# Patient Record
Sex: Male | Born: 1955 | Race: Black or African American | Hispanic: No | Smoking: Former smoker
Health system: Southern US, Community
[De-identification: ages and names within clinical notes are randomized; demographics above are authoritative.]

## PROBLEM LIST (undated history)

## (undated) DIAGNOSIS — Z972 Presence of dental prosthetic device (complete) (partial): Secondary | ICD-10-CM

## (undated) DIAGNOSIS — I10 Essential (primary) hypertension: Secondary | ICD-10-CM

## (undated) DIAGNOSIS — T7840XA Allergy, unspecified, initial encounter: Secondary | ICD-10-CM

## (undated) DIAGNOSIS — D649 Anemia, unspecified: Secondary | ICD-10-CM

## (undated) DIAGNOSIS — M1990 Unspecified osteoarthritis, unspecified site: Secondary | ICD-10-CM

## (undated) DIAGNOSIS — R519 Headache, unspecified: Secondary | ICD-10-CM

## (undated) DIAGNOSIS — G709 Myoneural disorder, unspecified: Secondary | ICD-10-CM

## (undated) DIAGNOSIS — R51 Headache: Secondary | ICD-10-CM

## (undated) DIAGNOSIS — K219 Gastro-esophageal reflux disease without esophagitis: Secondary | ICD-10-CM

## (undated) DIAGNOSIS — J40 Bronchitis, not specified as acute or chronic: Secondary | ICD-10-CM

## (undated) DIAGNOSIS — G8929 Other chronic pain: Secondary | ICD-10-CM

## (undated) DIAGNOSIS — Z973 Presence of spectacles and contact lenses: Secondary | ICD-10-CM

## (undated) DIAGNOSIS — E785 Hyperlipidemia, unspecified: Secondary | ICD-10-CM

## (undated) DIAGNOSIS — M542 Cervicalgia: Secondary | ICD-10-CM

## (undated) DIAGNOSIS — F419 Anxiety disorder, unspecified: Secondary | ICD-10-CM

## (undated) HISTORY — DX: Allergy, unspecified, initial encounter: T78.40XA

## (undated) HISTORY — DX: Unspecified osteoarthritis, unspecified site: M19.90

## (undated) HISTORY — PX: COLONOSCOPY: SHX174

## (undated) HISTORY — PX: JOINT REPLACEMENT: SHX530

## (undated) HISTORY — DX: Hyperlipidemia, unspecified: E78.5

## (undated) HISTORY — PX: TONSILLECTOMY: SUR1361

## (undated) HISTORY — PX: MULTIPLE TOOTH EXTRACTIONS: SHX2053

## (undated) HISTORY — PX: SHOULDER ARTHROSCOPY DISTAL CLAVICLE EXCISION AND OPEN ROTATOR CUFF REPAIR: SHX2396

---

## 1976-03-12 HISTORY — PX: KNEE SURGERY: SHX244

## 2010-05-29 ENCOUNTER — Ambulatory Visit: Payer: Non-veteran care | Attending: Family Medicine | Admitting: Rehabilitation

## 2010-05-29 DIAGNOSIS — M25669 Stiffness of unspecified knee, not elsewhere classified: Secondary | ICD-10-CM | POA: Insufficient documentation

## 2010-05-29 DIAGNOSIS — M25569 Pain in unspecified knee: Secondary | ICD-10-CM | POA: Insufficient documentation

## 2010-05-29 DIAGNOSIS — IMO0001 Reserved for inherently not codable concepts without codable children: Secondary | ICD-10-CM | POA: Insufficient documentation

## 2011-03-15 ENCOUNTER — Ambulatory Visit (INDEPENDENT_AMBULATORY_CARE_PROVIDER_SITE_OTHER): Payer: 59 | Admitting: Surgery

## 2011-03-15 ENCOUNTER — Encounter (INDEPENDENT_AMBULATORY_CARE_PROVIDER_SITE_OTHER): Payer: Self-pay | Admitting: Surgery

## 2011-03-15 VITALS — BP 142/98 | HR 44 | Temp 98.5°F | Resp 12 | Ht 74.0 in | Wt 261.8 lb

## 2011-03-15 DIAGNOSIS — K644 Residual hemorrhoidal skin tags: Secondary | ICD-10-CM

## 2011-03-15 DIAGNOSIS — K648 Other hemorrhoids: Secondary | ICD-10-CM | POA: Insufficient documentation

## 2011-03-15 NOTE — Patient Instructions (Signed)
We will schedule your surgery today.  [facility]  FULL RECTAL PREP INSTRUCTIONS (for Flexible Sigmoidoscopy / Rectal Surgery)    EVENING PRIOR TO SURGERY:   5:00pm:  Clear Liquid supper (jello-no fruit--, clear soups, tea, coffee)   No milk products.   7:00pm:  Take 2 oz (4 tablespoons) Milk of Magnesia.   DAY OF PROCEDURE:   1-2 hours before leaving house:  Take (2) Fleet enemas.  (These small enemas may be purchased at your local drug store, and may be the "drug store brand".  Do NOT purchase Mineral Oil enemas.  --Try to retain each enema for 5-10 minutes before expelling it.  This should clean your lower colon sufficiently, and the procedure should not need to be repeated.    PATIENTS HAVING SURGERY:  Do not eat or drink anything after midnight the night before your surgery.  PATIENTS HAVING OFFICE PROCEDURE:  Do not eat solid food after midnight, but you MAY have liquids.     If you have questions, please call our office and speak to a member of the clinic staff:  [phone].

## 2011-03-15 NOTE — Progress Notes (Signed)
Patient ID: Keith Calhoun, male   DOB: 1955-06-09, 56 y.o.   MRN: [id number]  Chief Complaint  Patient presents with  . Other    new pt- eval ext hems    HPI Keith Calhoun is a 56 y.o. male.  Self-referred for evaluation of hemorrhoids HPI This is a 56 year old male who presents with a 6 year history of intermittent rectal bleeding and prolapsing hemorrhoids. He receives most of his medical care through the [place]. He does report a colonoscopy within the last 3 years which was positive only for a small polyp which was not removed. He does get regular colonoscopies. He states that he occasionally becomes constipated depending on his diet. When he becomes constipated he develops swelling which is occasionally painful and he gets some bloody drainage. He has been using some suppositories with some mild improvement. He comes in today for surgical evaluation.  Past Medical History  Diagnosis Date  . Arthritis   . Diabetes mellitus   . Hyperlipidemia   . Hemorrhoids     Past Surgical History  Procedure Date  . Knee surgery 1978    left    Family History  Problem Relation Age of Onset  . Heart disease Father     Social History History  Substance Use Topics  . Smoking status: Former Smoker    Quit date: 03/12/2010  . Smokeless tobacco: Not on file  . Alcohol Use: Yes    No Known Allergies  Current Outpatient Prescriptions  Medication Sig Dispense Refill  . CANASA 1000 MG suppository daily.      . metFORMIN (GLUCOPHAGE) 850 MG tablet BID times 48H.      . pravastatin (PRAVACHOL) 10 MG tablet daily.      . Topiramate (TOPAMAX PO) Take by mouth daily.        Marland Kitchen VIAGRA 100 MG tablet Ad lib.        Review of Systems Review of Systems  Constitutional: Negative for fever, chills and unexpected weight change.  HENT: Positive for congestion. Negative for hearing loss, sore throat, trouble swallowing and voice change.   Eyes: Negative for visual disturbance.  Respiratory: Positive for  wheezing. Negative for cough.   Cardiovascular: Negative for chest pain, palpitations and leg swelling.  Gastrointestinal: Positive for constipation, blood in stool and anal bleeding. Negative for nausea, vomiting, abdominal pain, diarrhea, abdominal distention and rectal pain.  Genitourinary: Negative for hematuria and difficulty urinating.  Musculoskeletal: Positive for arthralgias.  Skin: Negative for rash and wound.  Neurological: Negative for seizures, syncope, weakness and headaches.  Hematological: Negative for adenopathy. Does not bruise/bleed easily.  Psychiatric/Behavioral: Negative for confusion.    Blood pressure 142/98, pulse 44, temperature 98.5 F (36.9 C), temperature source Temporal, resp. rate 12, height 6\' 2"  (1.88 m), weight 261 lb 12.8 oz (118.752 kg).  Physical Exam Physical Exam WDWN in NAD HEENT:  EOMI, sclera anicteric Neck:  No masses, no thyromegaly Lungs:  CTA bilaterally; normal respiratory effort CV:  Regular rate and rhythm; no murmurs Abd:  +bowel sounds, soft, non-tender, no masses Rectal - no significant external hemorrhoid disease, no abscess, no fistula, no fissure On the left, he has a large prolapsing internal hemorrhoid which is easily reducible; no other sign of prolapsing hemorrhoids Ext:  Well-perfused; no edema Skin:  Warm, dry; no sign of jaundice  Data Reviewed None  Assessment    Grade II/ III internal hemorrhoid    Plan    Hemorrhoidectomy.  The surgical procedure has  been discussed with the patient.  Potential risks, benefits, alternative treatments, and expected outcomes have been explained.  All of the patient's questions at this time have been answered.  The likelihood of reaching the patient's treatment goal is good.  The patient understand the proposed surgical procedure and wishes to proceed.        Teren Zurcher K. 03/15/2011, 10:26 AM

## 2011-04-13 HISTORY — PX: HEMORRHOID SURGERY: SHX153

## 2011-04-24 ENCOUNTER — Encounter (HOSPITAL_COMMUNITY): Payer: Self-pay | Admitting: Pharmacy Technician

## 2011-05-03 ENCOUNTER — Other Ambulatory Visit: Payer: Self-pay

## 2011-05-03 ENCOUNTER — Encounter (HOSPITAL_COMMUNITY)
Admission: RE | Admit: 2011-05-03 | Discharge: 2011-05-03 | Disposition: A | Discharge status: Home / Self Care | Payer: 59 | Source: Ambulatory Visit | Attending: Surgery | Admitting: Surgery

## 2011-05-03 ENCOUNTER — Encounter (HOSPITAL_COMMUNITY): Payer: Self-pay

## 2011-05-03 HISTORY — DX: Myoneural disorder, unspecified: G70.9

## 2011-05-03 HISTORY — DX: Gastro-esophageal reflux disease without esophagitis: K21.9

## 2011-05-03 HISTORY — DX: Anxiety disorder, unspecified: F41.9

## 2011-05-03 HISTORY — DX: Anemia, unspecified: D64.9

## 2011-05-03 LAB — SURGICAL PCR SCREEN: Staphylococcus aureus: NEGATIVE

## 2011-05-03 LAB — CBC
HCT: 39.2 % (ref 39.0–52.0)
MCH: 27 pg (ref 26.0–34.0)
MCHC: 33.9 g/dL (ref 30.0–36.0)
MCV: 79.7 fL (ref 78.0–100.0)
Platelets: 302 10*3/uL (ref 150–400)
RDW: 16.5 % — ABNORMAL HIGH (ref 11.5–15.5)
WBC: 9.1 10*3/uL (ref 4.0–10.5)

## 2011-05-03 LAB — BASIC METABOLIC PANEL
BUN: 10 mg/dL (ref 6–23)
CO2: 30 mEq/L (ref 19–32)
Calcium: 10 mg/dL (ref 8.4–10.5)
Creatinine, Ser: 1.02 mg/dL (ref 0.50–1.35)
Glucose, Bld: 104 mg/dL — ABNORMAL HIGH (ref 70–99)

## 2011-05-03 NOTE — Pre-Procedure Instructions (Signed)
20 Keith Calhoun  05/03/2011   Your procedure is scheduled on:  05/09/2011  Report to [facility] at 6:30 AM.  Call this number if you have problems the morning of surgery: [phone]   Remember:   Do not eat food:After Midnight.  May have clear liquids: up to 4 Hours before arrival.  Clear liquids include soda, tea, black coffee, apple or grape juice, broth.  Take these medicines the morning of surgery with A SIP OF WATER:     NOTHING   Do not wear jewelry, make-up or nail polish.  Do not wear lotions, powders, or perfumes. You may wear deodorant.  Do not shave 48 hours prior to surgery.  Do not bring valuables to the hospital.  Contacts, dentures or bridgework may not be worn into surgery.  Leave suitcase in the car. After surgery it may be brought to your room.  For patients admitted to the hospital, checkout time is 11:00 AM the day of discharge.   Patients discharged the day of surgery will not be allowed to drive home.  Name and phone number of your driver:     WITH MOTHER  Special Instructions: CHG Shower Use Special Wash: 1/2 bottle night before surgery and 1/2 bottle morning of surgery.   Please read over the following fact sheets that you were given: Pain Booklet, Coughing and Deep Breathing, MRSA Information and Surgical Site Infection Prevention

## 2011-05-03 NOTE — Progress Notes (Signed)
Call to [facility], left voicemail for [facility], Dr. Fatima Sanger  Nurse, to f/u with pt. If  MD wants pt. To do fleet enema a.m. of surg. , or call [facility] if we are indeed to give to pt. A.m. Of surg.

## 2011-05-04 ENCOUNTER — Telehealth (INDEPENDENT_AMBULATORY_CARE_PROVIDER_SITE_OTHER): Payer: Self-pay | Admitting: General Surgery

## 2011-05-04 NOTE — Telephone Encounter (Signed)
Called pt twice to let him know that Beth from pre op called yesterday about the pt had question about his bowel per op day before surgery. I left two message to let the pt know that I've left at the check in desk the print out of the bowel per op instructions and I told the pt if he had any question to call back and ask for Pattricia Boss, and he is also was told on his voice mail  that he needed to do this pre op bowel before surgery

## 2011-05-08 MED ORDER — DEXTROSE 5 % IV SOLN
2.0000 g | INTRAVENOUS | Status: AC
  Administered 2011-05-09: 2 g via INTRAVENOUS
  Filled 2011-05-08: qty 2

## 2011-05-09 ENCOUNTER — Encounter (HOSPITAL_COMMUNITY)
Admission: RE | Disposition: A | Discharge status: Home / Self Care | Payer: Self-pay | Source: Ambulatory Visit | Attending: Surgery

## 2011-05-09 ENCOUNTER — Encounter (HOSPITAL_COMMUNITY): Payer: Self-pay | Admitting: Anesthesiology

## 2011-05-09 ENCOUNTER — Ambulatory Visit (HOSPITAL_COMMUNITY)
Admission: RE | Admit: 2011-05-09 | Discharge: 2011-05-09 | Disposition: A | Discharge status: Home / Self Care | Payer: 59 | Source: Ambulatory Visit | Attending: Surgery | Admitting: Surgery

## 2011-05-09 ENCOUNTER — Ambulatory Visit (HOSPITAL_COMMUNITY): Payer: 59 | Admitting: Anesthesiology

## 2011-05-09 ENCOUNTER — Encounter (HOSPITAL_COMMUNITY): Payer: Self-pay | Admitting: *Deleted

## 2011-05-09 DIAGNOSIS — Z01812 Encounter for preprocedural laboratory examination: Secondary | ICD-10-CM | POA: Insufficient documentation

## 2011-05-09 DIAGNOSIS — K644 Residual hemorrhoidal skin tags: Secondary | ICD-10-CM

## 2011-05-09 DIAGNOSIS — E119 Type 2 diabetes mellitus without complications: Secondary | ICD-10-CM | POA: Insufficient documentation

## 2011-05-09 DIAGNOSIS — Z01810 Encounter for preprocedural cardiovascular examination: Secondary | ICD-10-CM | POA: Insufficient documentation

## 2011-05-09 DIAGNOSIS — E785 Hyperlipidemia, unspecified: Secondary | ICD-10-CM | POA: Insufficient documentation

## 2011-05-09 DIAGNOSIS — K648 Other hemorrhoids: Secondary | ICD-10-CM | POA: Insufficient documentation

## 2011-05-09 DIAGNOSIS — M129 Arthropathy, unspecified: Secondary | ICD-10-CM | POA: Insufficient documentation

## 2011-05-09 HISTORY — PX: HEMORRHOID SURGERY: SHX153

## 2011-05-09 SURGERY — HEMORRHOIDECTOMY
Anesthesia: General | Site: Anus | Wound class: Clean Contaminated

## 2011-05-09 MED ORDER — LACTATED RINGERS IV SOLN
INTRAVENOUS | Status: DC | PRN
  Administered 2011-05-09 (×2): via INTRAVENOUS

## 2011-05-09 MED ORDER — MIDAZOLAM HCL 5 MG/5ML IJ SOLN
INTRAMUSCULAR | Status: DC | PRN
  Administered 2011-05-09: 2 mg via INTRAVENOUS

## 2011-05-09 MED ORDER — OXYCODONE-ACETAMINOPHEN 5-325 MG PO TABS: 1.0000 | ORAL_TABLET | ORAL | Status: DC | PRN

## 2011-05-09 MED ORDER — BUPIVACAINE LIPOSOME 1.3 % IJ SUSP
INTRAMUSCULAR | Status: DC | PRN
  Administered 2011-05-09: 20 mL

## 2011-05-09 MED ORDER — ONDANSETRON HCL 4 MG/2ML IJ SOLN
INTRAMUSCULAR | Status: DC | PRN
  Administered 2011-05-09: 4 mg via INTRAVENOUS

## 2011-05-09 MED ORDER — ONDANSETRON HCL 4 MG/2ML IJ SOLN: 4.0000 mg | INTRAMUSCULAR | Status: DC | PRN

## 2011-05-09 MED ORDER — FLEET ENEMA 7-19 GM/118ML RE ENEM: 1.0000 | ENEMA | Freq: Once | RECTAL | Status: DC

## 2011-05-09 MED ORDER — FENTANYL CITRATE 0.05 MG/ML IJ SOLN
INTRAMUSCULAR | Status: DC | PRN
  Administered 2011-05-09 (×3): 50 ug via INTRAVENOUS

## 2011-05-09 MED ORDER — OXYCODONE-ACETAMINOPHEN 5-325 MG PO TABS: 1.0000 | ORAL_TABLET | ORAL | Status: AC | PRN

## 2011-05-09 MED ORDER — HYDROMORPHONE HCL PF 1 MG/ML IJ SOLN: 1.0000 mg | INTRAMUSCULAR | Status: DC | PRN

## 2011-05-09 MED ORDER — PROPOFOL 10 MG/ML IV EMUL
INTRAVENOUS | Status: DC | PRN
  Administered 2011-05-09: 200 mg via INTRAVENOUS

## 2011-05-09 MED ORDER — BUPIVACAINE LIPOSOME 1.3 % IJ SUSP
20.0000 mL | INTRAMUSCULAR | Status: DC
  Filled 2011-05-09: qty 20

## 2011-05-09 MED ORDER — HEMOSTATIC AGENTS (NO CHARGE) OPTIME
TOPICAL | Status: DC | PRN
  Administered 2011-05-09: 1 via TOPICAL

## 2011-05-09 SURGICAL SUPPLY — 46 items
BLADE SURG 15 STRL LF DISP TIS (BLADE) ×1 IMPLANT
BLADE SURG 15 STRL SS (BLADE) ×2
CANISTER SUCTION 2500CC (MISCELLANEOUS) ×2 IMPLANT
CLOTH BEACON ORANGE TIMEOUT ST (SAFETY) ×2 IMPLANT
COVER MAYO STAND STRL (DRAPES) ×2 IMPLANT
COVER SURGICAL LIGHT HANDLE (MISCELLANEOUS) ×2 IMPLANT
DECANTER SPIKE VIAL GLASS SM (MISCELLANEOUS) ×1 IMPLANT
DRAPE UTILITY 15X26 W/TAPE STR (DRAPE) ×4 IMPLANT
DRSG PAD ABDOMINAL 8X10 ST (GAUZE/BANDAGES/DRESSINGS) ×2 IMPLANT
ELECT CAUTERY BLADE 6.4 (BLADE) ×2 IMPLANT
ELECT REM PT RETURN 9FT ADLT (ELECTROSURGICAL) ×2
ELECTRODE REM PT RTRN 9FT ADLT (ELECTROSURGICAL) ×1 IMPLANT
GAUZE SPONGE 4X4 16PLY XRAY LF (GAUZE/BANDAGES/DRESSINGS) ×2 IMPLANT
GLOVE BIO SURGEON STRL SZ7 (GLOVE) ×2 IMPLANT
GLOVE BIOGEL PI IND STRL 7.0 (GLOVE) IMPLANT
GLOVE BIOGEL PI IND STRL 7.5 (GLOVE) ×1 IMPLANT
GLOVE BIOGEL PI INDICATOR 7.0 (GLOVE) ×1
GLOVE BIOGEL PI INDICATOR 7.5 (GLOVE) ×1
GLOVE SURG SS PI 6.5 STRL IVOR (GLOVE) ×1 IMPLANT
GOWN STRL NON-REIN LRG LVL3 (GOWN DISPOSABLE) ×4 IMPLANT
KIT BASIN OR (CUSTOM PROCEDURE TRAY) ×2 IMPLANT
KIT ROOM TURNOVER OR (KITS) ×2 IMPLANT
NDL HYPO 25GX1X1/2 BEV (NEEDLE) ×1 IMPLANT
NEEDLE HYPO 25GX1X1/2 BEV (NEEDLE) ×2 IMPLANT
NS IRRIG 1000ML POUR BTL (IV SOLUTION) ×2 IMPLANT
PACK LITHOTOMY IV (CUSTOM PROCEDURE TRAY) ×2 IMPLANT
PAD ARMBOARD 7.5X6 YLW CONV (MISCELLANEOUS) ×4 IMPLANT
PENCIL BUTTON HOLSTER BLD 10FT (ELECTRODE) ×2 IMPLANT
SHEARS HARMONIC 9CM CVD (BLADE) ×1 IMPLANT
SPECIMEN JAR SMALL (MISCELLANEOUS) IMPLANT
SPONGE GAUZE 4X4 12PLY (GAUZE/BANDAGES/DRESSINGS) ×1 IMPLANT
SPONGE SURGIFOAM ABS GEL 100 (HEMOSTASIS) ×1 IMPLANT
SURGILUBE 2OZ TUBE FLIPTOP (MISCELLANEOUS) ×2 IMPLANT
SUT CHROMIC 2 0 SH (SUTURE) IMPLANT
SUT CHROMIC 3 0 SH 27 (SUTURE) IMPLANT
SUT VIC AB 3-0 SH 18 (SUTURE) ×2 IMPLANT
SUT VIC AB 3-0 SH 27 (SUTURE) ×2
SUT VIC AB 3-0 SH 27X BRD (SUTURE) IMPLANT
SYR BULB 3OZ (MISCELLANEOUS) ×2 IMPLANT
SYR CONTROL 10ML LL (SYRINGE) ×2 IMPLANT
TAPE CLOTH SURG 6X10 WHT LF (GAUZE/BANDAGES/DRESSINGS) ×1 IMPLANT
TOWEL OR 17X24 6PK STRL BLUE (TOWEL DISPOSABLE) ×2 IMPLANT
TOWEL OR 17X26 10 PK STRL BLUE (TOWEL DISPOSABLE) ×2 IMPLANT
TUBE CONNECTING 12X1/4 (SUCTIONS) ×2 IMPLANT
WATER STERILE IRR 1000ML POUR (IV SOLUTION) IMPLANT
YANKAUER SUCT BULB TIP NO VENT (SUCTIONS) ×2 IMPLANT

## 2011-05-09 NOTE — Discharge Instructions (Signed)
[facility] _______[facility]  RECTAL SURGERY POST OP INSTRUCTIONS: POST OP INSTRUCTIONS  Always review your discharge instruction sheet given to you by the facility where your surgery was performed. IF YOU HAVE DISABILITY OR FAMILY LEAVE FORMS, YOU MUST BRING THEM TO THE OFFICE FOR PROCESSING.   DO NOT GIVE THEM TO YOUR DOCTOR.  1. A  prescription for pain medication may be given to you upon discharge.  Take your pain medication as prescribed, if needed.  If narcotic pain medicine is not needed, then you may take acetaminophen (Tylenol) or ibuprofen (Advil) as needed. 2. Take your usually prescribed medications unless otherwise directed.  Stop using the suppositories until the pain is completely gone. 3. If you need a refill on your pain medication, please contact your pharmacy.  They will contact our office to request authorization. Prescriptions will not be filled after 5 pm or on week-ends. 4. You should follow a light diet the first 48 hours after arrival home, such as soup and crackers, etc.  Be sure to include lots of fluids daily.  Resume your normal diet 2-3 days after surgery.. 5. Most patients will experience some swelling and discomfort in the rectal area. Ice packs, reclining and warm tub soaks will help.  Swelling and discomfort can take several days to resolve.  6. It is common to experience some constipation if taking pain medication after surgery.  Increasing fluid intake and taking a stool softener (such as Colace) will usually help or prevent this problem from occurring.  A mild laxative (Milk of Magnesia or Miralax) should be taken according to package directions if there are no bowel movements after 48 hours. 7. Unless discharge instructions indicate otherwise, leave your bandage dry and in place for 24 hours, or remove the bandage if you have a bowel movement. You may notice a small amount of bleeding with bowel movements for the first few days. You may have some packing  in the rectum which will come out over the first day or two. You will need to wear an absorbent pad or soft cotton gauze in your underwear until the drainage stops. 8. ACTIVITIES:  You may resume regular (light) daily activities beginning the next day--such as daily self-care, walking, climbing stairs--gradually increasing activities as tolerated.  You may have sexual intercourse when it is comfortable.  Refrain from any heavy lifting or straining until approved by your doctor. a. You may drive when you are no longer taking prescription pain medication, you can comfortably wear a seatbelt, and you can safely maneuver your car and apply brakes. b. RETURN TO WORK: : 1-2 weeks 9. You should see your doctor in the office for a follow-up appointment approximately 2-3 weeks after your surgery.  Make sure that you call for this appointment within a day or two after you arrive home to insure a convenient appointment time. 10. OTHER INSTRUCTIONS:  __________________________________________________________________________________________________________________________________________________________________________________________  WHEN TO CALL YOUR DOCTOR: 1. Fever over 101.0 2. Inability to urinate 3. Nausea and/or vomiting 4. Extreme swelling or bruising 5. Continued bleeding from rectum. 6. Increased pain, redness, or drainage from the incision 7. Constipation  The clinic staff is available to answer your questions during regular business hours.  Please don't hesitate to call and ask to speak to one of the nurses for clinical concerns.  If you have a medical emergency, go to the nearest emergency room or call 911.  A surgeon from [facility] is always on call at the hospital  [address], [place], [place]  [address] ?  [address] [address], [place], [place]   [address] [phone] ? [phone] ? FAX [phone] Web site:  [email or web address]  Hemorrhoids Hemorrhoids are enlarged (dilated) veins around the rectum. There are 2 types of hemorrhoids, and the type of hemorrhoid is determined by its location. Internal hemorrhoids occur in the veins just inside the rectum.They are usually not painful, but they may bleed.However, they may poke through to the outside and become irritated and painful. External hemorrhoids involve the veins outside the anus and can be felt as a painful swelling or hard lump near the anus.They are often itchy and may crack and bleed. Sometimes clots will form in the veins. This makes them swollen and painful. These are called thrombosed hemorrhoids. CAUSES Causes of hemorrhoids include:  Pregnancy. This increases the pressure in the hemorrhoidal veins.   Constipation.   Straining to have a bowel movement.   Obesity.   Heavy lifting or other activity that caused you to strain.  TREATMENT Most of the time hemorrhoids improve in 1 to 2 weeks. However, if symptoms do not seem to be getting better or if you have a lot of rectal bleeding, your caregiver may perform a procedure to help make the hemorrhoids get smaller or remove them completely.Possible treatments include:  Rubber band ligation. A rubber band is placed at the base of the hemorrhoid to cut off the circulation.   Sclerotherapy. A chemical is injected to shrink the hemorrhoid.   Infrared light therapy. Tools are used to burn the hemorrhoid.   Hemorrhoidectomy. This is surgical removal of the hemorrhoid.  HOME CARE INSTRUCTIONS   Increase fiber in your diet. Ask your caregiver about using fiber supplements.   Drink enough water and fluids to keep your urine clear or pale yellow.   Exercise regularly.   Go to the bathroom when you have the urge to have a bowel movement. Do not wait.   Avoid straining to have bowel movements.   Keep the anal area dry and clean.   Only take over-the-counter or  prescription medicines for pain, discomfort, or fever as directed by your caregiver.  If your hemorrhoids are thrombosed:  Take warm sitz baths for 20 to 30 minutes, 3 to 4 times per day.   If the hemorrhoids are very tender and swollen, place ice packs on the area as tolerated. Using ice packs between sitz baths may be helpful. Fill a plastic bag with ice. Place a towel between the bag of ice and your skin.   Medicated creams and suppositories may be used or applied as directed.   Do not use a donut-shaped pillow or sit on the toilet for long periods. This increases blood pooling and pain.  SEEK MEDICAL CARE IF:   You have increasing pain and swelling that is not controlled with your medicine.   You have uncontrolled bleeding.   You have difficulty or you are unable to have a bowel movement.   You have pain or inflammation outside the area of the hemorrhoids.   You have chills or an oral temperature above 102 F (38.9 C).  MAKE SURE YOU:   Understand these instructions.   Will watch your condition.   Will get help right away if you are not doing well or get worse.  Document Released: 02/24/2000 Document Revised: 11/08/2010 Document Reviewed: 07/01/2007 [facility] Patient Information 2012 [place], [place].

## 2011-05-09 NOTE — Interval H&P Note (Signed)
History and Physical Interval Note:  05/09/2011 7:34 AM  Keith Calhoun  has presented today for surgery, with the diagnosis of hemorrhoids   The various methods of treatment have been discussed with the patient and family. After consideration of risks, benefits and other options for treatment, the patient has consented to  Procedure(s) (LRB): HEMORRHOIDECTOMY (N/A) as a surgical intervention .  The patients' history has been reviewed, patient examined, no change in status, stable for surgery.  I have reviewed the patients' chart and labs.  Questions were answered to the patient's satisfaction.     Jari Carollo K.

## 2011-05-09 NOTE — Anesthesia Postprocedure Evaluation (Signed)
  Anesthesia Post-op Note  Patient: Keith Calhoun  Procedure(s) Performed: Procedure(s) (LRB): HEMORRHOIDECTOMY (N/A)  Patient Location: PACU  Anesthesia Type: General  Level of Consciousness: awake  Airway and Oxygen Therapy: Patient Spontanous Breathing  Post-op Pain: mild  Post-op Assessment: Post-op Vital signs reviewed  Post-op Vital Signs: stable  Complications: No apparent anesthesia complications

## 2011-05-09 NOTE — H&P (Signed)
Progress Notes   Keith Calhoun (MR# [id number])           Progress Notes     Patient ID: Keith Calhoun, male   DOB: 06-Feb-1956, 56 y.o.   MRN: [id number]    Chief Complaint   Patient presents with   .  Other       new pt- eval ext hems        HPI Keith Calhoun is a 56 y.o. male.  Self-referred for evaluation of hemorrhoids HPI This is a 56 year old male who presents with a 6 year history of intermittent rectal bleeding and prolapsing hemorrhoids. He receives most of his medical care through the [place]. He does report a colonoscopy within the last 3 years which was positive only for a small polyp which was not removed. He does get regular colonoscopies. He states that he occasionally becomes constipated depending on his diet. When he becomes constipated he develops swelling which is occasionally painful and he gets some bloody drainage. He has been using some suppositories with some mild improvement. He comes in today for surgical evaluation.    Past Medical History   Diagnosis  Date   .  Arthritis     .  Diabetes mellitus     .  Hyperlipidemia     .  Hemorrhoids           Past Surgical History   Procedure  Date   .  Knee surgery  1978       left         Family History   Problem  Relation  Age of Onset   .  Heart disease  Father          Social History History   Substance Use Topics   .  Smoking status:  Former Smoker       Quit date:  03/12/2010   .  Smokeless tobacco:  Not on file   .  Alcohol Use:  Yes        No Known Allergies    Current Outpatient Prescriptions   Medication  Sig  Dispense  Refill   .  CANASA 1000 MG suppository  daily.         .  metFORMIN (GLUCOPHAGE) 850 MG tablet  BID times 48H.         .  pravastatin (PRAVACHOL) 10 MG tablet  daily.         .  Topiramate (TOPAMAX PO)  Take by mouth daily.           Marland Kitchen  VIAGRA 100 MG tablet  Ad lib.               Review of Systems  Constitutional: Negative for fever, chills and unexpected  weight change.  HENT: Positive for congestion. Negative for hearing loss, sore throat, trouble swallowing and voice change.   Eyes: Negative for visual disturbance.  Respiratory: Positive for wheezing. Negative for cough.   Cardiovascular: Negative for chest pain, palpitations and leg swelling.  Gastrointestinal: Positive for constipation, blood in stool and anal bleeding. Negative for nausea, vomiting, abdominal pain, diarrhea, abdominal distention and rectal pain.  Genitourinary: Negative for hematuria and difficulty urinating.  Musculoskeletal: Positive for arthralgias.  Skin: Negative for rash and wound.  Neurological: Negative for seizures, syncope, weakness and headaches.  Hematological: Negative for adenopathy. Does not bruise/bleed easily.  Psychiatric/Behavioral: Negative for confusion.      Blood pressure 142/98, pulse 44,  temperature 98.5 F (36.9 C), temperature source Temporal, resp. rate 12, height 6\' 2"  (1.88 m), weight 261 lb 12.8 oz (118.752 kg).   Physical Exam  WDWN in NAD HEENT:  EOMI, sclera anicteric Neck:  No masses, no thyromegaly Lungs:  CTA bilaterally; normal respiratory effort CV:  Regular rate and rhythm; no murmurs Abd:  +bowel sounds, soft, non-tender, no masses Rectal - no significant external hemorrhoid disease, no abscess, no fistula, no fissure On the left, he has a large prolapsing internal hemorrhoid which is easily reducible; no other sign of prolapsing hemorrhoids Ext:  Well-perfused; no edema Skin:  Warm, dry; no sign of jaundice   Data Reviewed None   Assessment    Grade II/ III internal hemorrhoid     Plan    Hemorrhoidectomy.  The surgical procedure has been discussed with the patient.  Potential risks, benefits, alternative treatments, and expected outcomes have been explained.  All of the patient's questions at this time have been answered.  The likelihood of reaching the patient's treatment goal is good.  The patient  understand the proposed surgical procedure and wishes to proceed.        Wilmon Arms. Corliss Skains, MD, [facility]  05/09/2011 7:33 AM

## 2011-05-09 NOTE — Preoperative (Signed)
Beta Blockers   Reason not to administer Beta Blockers:Not Applicable 

## 2011-05-09 NOTE — Op Note (Signed)
Preop diagnosis: Prolapsed internal hemorrhoid Postop diagnosis: Same Procedure: Single column internal hemorrhoidectomy Surgeon:Nehemie Casserly K. Anesthesia: Gen. via LMA Indications: This is a 56 year old male who presents with intermittent prolapse of a single internal hemorrhoid. This is associated with some bleeding. On examination he was noted to have a single prolapsing internal hemorrhoid on the left. He presents now for hemorrhoidectomy.  Description of procedure:   the patient was brought to the operating room and placed in a supine position on the operating room table. After an adequate level of general anesthesia was obtained, the patient's legs were placed in lithotomy position in yellowfin stirrups. His perineum was prepped with Betadine and draped sterile fashion. A timeout was taken to ensure the proper patient and proper procedure. The patient has minimal external hemorrhoidal disease. His anus was dilated up to 3 fingers. The silver bullet retractor was inserted. We inspected the rectum circumferentially. Just above the dentate line on the left there is a large inflamed appearing internal hemorrhoid. The remainder of the circumference of the rectum shows small internal hemorrhoids but none of these seem inflamed or prolapsed. We grasped the large left-sided internal hemorrhoid with a Pennington clamp. The harmonic scalpel was used to score the mucosa. We bluntly dissected the hemorrhoidal tissue away from the sphincter muscle and excised the entire hemorrhoid with the harmonic scalpel. We carefully inspected for hemostasis. We then reapproximated the edges of the mucosa with 3-0 Vicryl. We ran this almost to the dentate line. We left proximal half a centimeter open to allow for drainage. We infiltrated all around the sphincters with Exparel. The rectum was packed with Gelfoam. A dry dressing was applied. The patient was extubated but recovery room in stable condition. All sponge, instrument,  and needle counts are correct.  Hanna Aultman K. 05/09/2011 9:19 AM

## 2011-05-09 NOTE — Transfer of Care (Signed)
Immediate Anesthesia Transfer of Care Note  Patient: Keith Calhoun  Procedure(s) Performed: Procedure(s) (LRB): HEMORRHOIDECTOMY (N/A)  Patient Location: PACU  Anesthesia Type: General  Level of Consciousness: awake, alert , oriented and sedated  Airway & Oxygen Therapy: Patient Spontanous Breathing  Post-op Assessment: Report given to PACU RN, Post -op Vital signs reviewed and stable and Patient moving all extremities  Post vital signs: Reviewed  Complications: No apparent anesthesia complications

## 2011-05-09 NOTE — Anesthesia Preprocedure Evaluation (Signed)
Anesthesia Evaluation    Airway Mallampati: II  Neck ROM: Full    Dental  (+) Missing and Partial Upper   Pulmonary  clear to auscultation        Cardiovascular Regular Normal    Neuro/Psych    GI/Hepatic   Endo/Other    Renal/GU      Musculoskeletal   Abdominal   Peds  Hematology   Anesthesia Other Findings   Reproductive/Obstetrics                           Anesthesia Physical Anesthesia Plan  ASA: II  Anesthesia Plan: General   Post-op Pain Management:    Induction: Intravenous  Airway Management Planned: Oral ETT  Additional Equipment:   Intra-op Plan:   Post-operative Plan: Extubation in OR  Informed Consent: I have reviewed the patients History and Physical, chart, labs and discussed the procedure including the risks, benefits and alternatives for the proposed anesthesia with the patient or authorized representative who has indicated his/her understanding and acceptance.   Dental advisory given  Plan Discussed with: CRNA and Surgeon  Anesthesia Plan Comments:         Anesthesia Quick Evaluation

## 2011-05-10 ENCOUNTER — Encounter (INDEPENDENT_AMBULATORY_CARE_PROVIDER_SITE_OTHER): Payer: Self-pay | Admitting: Surgery

## 2011-05-29 ENCOUNTER — Encounter (INDEPENDENT_AMBULATORY_CARE_PROVIDER_SITE_OTHER): Payer: 59 | Admitting: Surgery

## 2011-06-22 ENCOUNTER — Encounter (INDEPENDENT_AMBULATORY_CARE_PROVIDER_SITE_OTHER): Payer: 59 | Admitting: Surgery

## 2012-10-08 ENCOUNTER — Other Ambulatory Visit (HOSPITAL_COMMUNITY): Payer: Self-pay | Admitting: Oncology

## 2014-07-22 DIAGNOSIS — M1712 Unilateral primary osteoarthritis, left knee: Secondary | ICD-10-CM | POA: Insufficient documentation

## 2014-09-07 DIAGNOSIS — K219 Gastro-esophageal reflux disease without esophagitis: Secondary | ICD-10-CM | POA: Insufficient documentation

## 2014-09-07 DIAGNOSIS — E785 Hyperlipidemia, unspecified: Secondary | ICD-10-CM | POA: Insufficient documentation

## 2014-09-07 DIAGNOSIS — E119 Type 2 diabetes mellitus without complications: Secondary | ICD-10-CM | POA: Insufficient documentation

## 2014-09-07 DIAGNOSIS — E1122 Type 2 diabetes mellitus with diabetic chronic kidney disease: Secondary | ICD-10-CM | POA: Insufficient documentation

## 2014-09-12 ENCOUNTER — Encounter (HOSPITAL_COMMUNITY): Payer: Self-pay | Admitting: Emergency Medicine

## 2014-09-12 ENCOUNTER — Emergency Department (HOSPITAL_COMMUNITY): Payer: Non-veteran care

## 2014-09-12 ENCOUNTER — Emergency Department (HOSPITAL_COMMUNITY)
Admission: EM | Admit: 2014-09-12 | Discharge: 2014-09-12 | Disposition: A | Discharge status: Home / Self Care | Payer: Non-veteran care | Attending: Emergency Medicine | Admitting: Emergency Medicine

## 2014-09-12 DIAGNOSIS — M549 Dorsalgia, unspecified: Secondary | ICD-10-CM | POA: Diagnosis not present

## 2014-09-12 DIAGNOSIS — Z87891 Personal history of nicotine dependence: Secondary | ICD-10-CM | POA: Insufficient documentation

## 2014-09-12 DIAGNOSIS — R0602 Shortness of breath: Secondary | ICD-10-CM | POA: Diagnosis present

## 2014-09-12 DIAGNOSIS — J4541 Moderate persistent asthma with (acute) exacerbation: Secondary | ICD-10-CM | POA: Insufficient documentation

## 2014-09-12 DIAGNOSIS — J40 Bronchitis, not specified as acute or chronic: Secondary | ICD-10-CM

## 2014-09-12 DIAGNOSIS — Z862 Personal history of diseases of the blood and blood-forming organs and certain disorders involving the immune mechanism: Secondary | ICD-10-CM | POA: Insufficient documentation

## 2014-09-12 DIAGNOSIS — Z79899 Other long term (current) drug therapy: Secondary | ICD-10-CM | POA: Diagnosis not present

## 2014-09-12 DIAGNOSIS — Z8669 Personal history of other diseases of the nervous system and sense organs: Secondary | ICD-10-CM | POA: Insufficient documentation

## 2014-09-12 DIAGNOSIS — K219 Gastro-esophageal reflux disease without esophagitis: Secondary | ICD-10-CM | POA: Insufficient documentation

## 2014-09-12 DIAGNOSIS — M19012 Primary osteoarthritis, left shoulder: Secondary | ICD-10-CM | POA: Diagnosis not present

## 2014-09-12 DIAGNOSIS — M1712 Unilateral primary osteoarthritis, left knee: Secondary | ICD-10-CM | POA: Diagnosis not present

## 2014-09-12 DIAGNOSIS — E785 Hyperlipidemia, unspecified: Secondary | ICD-10-CM | POA: Diagnosis not present

## 2014-09-12 DIAGNOSIS — E119 Type 2 diabetes mellitus without complications: Secondary | ICD-10-CM | POA: Diagnosis not present

## 2014-09-12 DIAGNOSIS — Z8659 Personal history of other mental and behavioral disorders: Secondary | ICD-10-CM | POA: Diagnosis not present

## 2014-09-12 HISTORY — DX: Bronchitis, not specified as acute or chronic: J40

## 2014-09-12 MED ORDER — IPRATROPIUM-ALBUTEROL 0.5-2.5 (3) MG/3ML IN SOLN
3.0000 mL | Freq: Once | RESPIRATORY_TRACT | Status: AC
  Administered 2014-09-12: 3 mL via RESPIRATORY_TRACT
  Filled 2014-09-12: qty 3

## 2014-09-12 MED ORDER — ALBUTEROL SULFATE HFA 108 (90 BASE) MCG/ACT IN AERS
2.0000 | INHALATION_SPRAY | Freq: Four times a day (QID) | RESPIRATORY_TRACT | Status: DC
  Administered 2014-09-12: 2 via RESPIRATORY_TRACT
  Filled 2014-09-12: qty 6.7

## 2014-09-12 NOTE — ED Notes (Signed)
Pt c/o SOB x 3 days and thinks its his bronchitis. Pt talking in complete sentences without difficulty.

## 2014-09-12 NOTE — ED Provider Notes (Signed)
CSN: [id number]     Arrival date & time 09/12/14  0917 History   First MD Initiated Contact with Patient 09/12/14 [phone]     Chief Complaint  Patient presents with  . Shortness of Breath     (Consider location/radiation/quality/duration/timing/severity/associated sxs/prior Treatment) Patient is a 59 y.o. male presenting with shortness of breath. The history is provided by the patient.  Shortness of Breath Associated symptoms: cough and wheezing   Associated symptoms: no abdominal pain, no chest pain, no fever, no headaches and no rash    agent would complain of shortness of breath for 3 days. Thinks he has recurrent bronchitis. Patient has had trouble previously during the summertime. There is some question of allergies. Patient in the past been treated with an albuterol inhaler in the long-acting and a histamine for this. Patient arrived talking in complete sentences. Oxygen saturation 98% on room air. No tachypnea.  Past Medical History  Diagnosis Date  . Diabetes mellitus   . Hyperlipidemia   . Hemorrhoids   . Anemia     2012- turned down for donating blood, due to low count   . GERD (gastroesophageal reflux disease)   . Arthritis     L knee , L shoudler   . Anxiety     claustrophobia   . 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 Bronchitis    Past Surgical History  Procedure Laterality Date  . Knee surgery  1978    left  . Shoulder arthroscopy distal clavicle excision and open rotator cuff repair      L shoulder   . Tonsillectomy      as a child  . Hemorrhoid surgery  04/2011  . Hemorrhoid surgery  05/09/2011    Procedure: HEMORRHOIDECTOMY;  Surgeon: Imogene Burn. Georgette Dover, MD;  Location: [facility];  Service: General;  Laterality: N/A;   Family History  Problem Relation Age of Onset  . Heart disease Father   . Anesthesia problems Neg Hx   . Hypotension Neg Hx   . Malignant hyperthermia Neg Hx   . Pseudochol deficiency Neg Hx    History  Substance Use Topics  .  Smoking status: Former Smoker    Quit date: 03/13/2007  . Smokeless tobacco: Not on file  . Alcohol Use: Yes     Comment: social drinking     Review of Systems  Constitutional: Negative for fever.  HENT: Positive for congestion.   Eyes: Negative for redness.  Respiratory: Positive for cough, shortness of breath and wheezing.   Cardiovascular: Negative for chest pain.  Gastrointestinal: Negative for abdominal pain.  Genitourinary: Negative for dysuria.  Musculoskeletal: Positive for back pain.  Skin: Negative for rash.  Neurological: Negative for headaches.  Hematological: Does not bruise/bleed easily.  Psychiatric/Behavioral: Negative for confusion.      Allergies  Review of patient's allergies indicates no known allergies.  Home Medications   Prior to Admission medications   Medication Sig Start Date End Date Taking? Authorizing Provider  Dextromethorphan-Guaifenesin (ROBITUSSIN COUGH+CHEST CONG DM PO) Take 10 mLs by mouth every 4 (four) hours as needed (congestion).   Yes Historical Provider, MD  guaiFENesin (MUCINEX) 600 MG 12 hr tablet Take 600 mg by mouth daily as needed (congestion).   Yes Historical Provider, MD  loratadine (CLARITIN) 10 MG tablet Take 10 mg by mouth daily.   Yes Historical Provider, MD  metFORMIN (GLUCOPHAGE) 850 MG tablet 850 mg 2 (two) times daily with a meal.  01/16/11  Yes  Historical Provider, MD  Phenylephrine-DM-GG-APAP (MUCINEX SINUS-MAX) 5-10-200-325 MG CAPS Take 1 tablet by mouth daily as needed (congestion).   Yes Historical Provider, MD  pravastatin (PRAVACHOL) 10 MG tablet Take 10 mg by mouth daily at 2 PM daily at 2 PM.  02/02/11  Yes Historical Provider, MD  ranitidine (ZANTAC) 150 MG tablet Take 300 mg by mouth 2 (two) times daily.   Yes Historical Provider, MD  traMADol (ULTRAM) 50 MG tablet Take 100 mg by mouth daily as needed. arthritis pain   Yes Historical Provider, MD  VIAGRA 100 MG tablet Take 100 mg by mouth.  02/22/11  Yes  Historical Provider, MD   BP 167/86 mmHg  Pulse 45  Temp(Src) 98.6 F (37 C) (Oral)  Resp 13  Ht 6\' 2"  (1.88 m)  Wt 260 lb (117.935 kg)  BMI 33.37 kg/m2  SpO2 98% Physical Exam  Constitutional: He is oriented to person, place, and time. He appears well-developed and well-nourished. No distress.  HENT:  Head: Normocephalic and atraumatic.  Mouth/Throat: Oropharynx is clear and moist.  Eyes: Conjunctivae and EOM are normal. Pupils are equal, round, and reactive to light.  Neck: Normal range of motion.  Cardiovascular: Normal rate, regular rhythm and normal heart sounds.   Pulmonary/Chest: Effort normal. No respiratory distress. He has wheezes.  Abdominal: Soft. Bowel sounds are normal. There is no tenderness.  Musculoskeletal: Normal range of motion. He exhibits no edema.  Neurological: He is alert and oriented to person, place, and time. No cranial nerve deficit. He exhibits normal muscle tone. Coordination normal.  Skin: Skin is warm. No rash noted.  Nursing note and vitals reviewed.   ED Course  Procedures (including critical care time) Labs Review Labs Reviewed - No data to display  Imaging Review Dg Chest 2 View (if Patient Has Fever And/or Copd)  09/12/2014   CLINICAL DATA:  Shortness of breath for 3 days, wheezing.  EXAM: CHEST  2 VIEW  COMPARISON:  None.  FINDINGS: Mild peribronchial thickening. Heart and mediastinal contours are within normal limits. No focal opacities or effusions. No acute bony abnormality.  IMPRESSION: Mild bronchitic changes.   Electronically Signed   By: Rolm Baptise M.D.   On: 09/12/2014 10:08     EKG Interpretation   Date/Time:  Sunday September 12 2014 09:32:30 EDT Ventricular Rate:  45 PR Interval:  155 QRS Duration: 88 QT Interval:  472 QTC Calculation: 408 R Axis:   63 Text Interpretation:  Sinus bradycardia Anteroseptal infarct, old Baseline  wander in lead(s) V4 No significant change since last tracing Confirmed by  Gryphon Vanderveen  MD, Maysen Sudol  [phone]) on 09/12/2014 9:45:32 AM      MDM   Final diagnoses:  Bronchitis  Reactive airway disease, moderate persistent, with acute exacerbation    Patient with 3 day history of shortness of breath cough some phlegm. Patient has similar problems last year at this time. Was treated with a long-acting and histamine. Normally gets wheezing when this occurs. Tends to happen in the summertime only.  Patient chest x-ray was negative for pneumonia pneumothorax or pulmonary edema.  Patient was wheezing here. Patient given nebulizer of albuterol and Atrovent wheezing all resolved. Will discharge patient home with albuterol. And recommend a long-acting and histamine like Zyrtec.    Fredia Sorrow, MD 09/12/14 [phone]

## 2014-09-12 NOTE — Discharge Instructions (Signed)
Use albuterol inhaler 2 puffs every 6 hours for the next 7 days then as needed. Also recommend considering a long-acting and histamine like Zyrtec. Chest x-ray was negative for pneumonia. Return for any new or worse symptoms.

## 2014-10-21 DIAGNOSIS — Z96652 Presence of left artificial knee joint: Secondary | ICD-10-CM | POA: Insufficient documentation

## 2015-12-08 ENCOUNTER — Other Ambulatory Visit: Payer: Self-pay | Admitting: Internal Medicine

## 2015-12-08 DIAGNOSIS — M542 Cervicalgia: Secondary | ICD-10-CM

## 2015-12-15 ENCOUNTER — Other Ambulatory Visit: Payer: Non-veteran care

## 2015-12-17 ENCOUNTER — Other Ambulatory Visit: Payer: Non-veteran care

## 2015-12-28 ENCOUNTER — Other Ambulatory Visit: Payer: Non-veteran care

## 2016-04-05 ENCOUNTER — Other Ambulatory Visit: Payer: Self-pay | Admitting: Internal Medicine

## 2016-04-05 ENCOUNTER — Other Ambulatory Visit (HOSPITAL_COMMUNITY): Payer: Self-pay | Admitting: Internal Medicine

## 2016-04-05 DIAGNOSIS — M542 Cervicalgia: Secondary | ICD-10-CM

## 2016-04-18 NOTE — Pre-Procedure Instructions (Addendum)
JL MANDLER  04/18/2016      [id number] [address], [place] [address] Phone: [phone] Fax: [phone]  [id number] [address], [place] [address] [place] [address] [place] [address] Phone: [phone] Fax: [phone]    Your procedure is scheduled on February 13th, Tuesday   Report to [facility] Admitting at 6:00 Am             (posted surgery time 8:00 am - 10:00 am)   Call this number if you have problems the [facility] of surgery:  [phone].  [place] is closed on weekends.   Remember:  Do not eat food or drink liquids after midnight Monday.   Take these medicines the morning of surgery : Please use your inhaler that morning.              4-5 days prior to surgery, STOP taking any Vitamins, Herbal Supplements, Anti-inflammatories   Do not wear jewelry - no rings or watches.  Do not wear lotions, colognes or deoderant.             Men may shave face and neck.   Do not bring valuables to the hospital.  [facility] is not responsible for any belongings or valuables.  Contacts, dentures or bridgework may not be worn into surgery.  Leave your suitcase in the car.  After surgery it may be brought to your room.  Patients discharged the day of surgery will not be allowed to drive home.  AND you will need someone to stay with you for the first 24 hrs.   Please read over the following fact sheets that you were given. Pain Booklet          How to Manage Your Diabetes Before and After Surgery  Why is it important to control my blood sugar before and after surgery? . Improving blood sugar levels before and after surgery helps healing and can limit problems. . A way of improving blood sugar control is eating a healthy diet by: o  Eating less sugar and carbohydrates o  Increasing activity/exercise o  Talking with  your doctor about reaching your blood sugar goals . High blood sugars (greater than 180 mg/dL) can raise your risk of infections and slow your recovery, so you will need to focus on controlling your diabetes during the weeks before surgery. . Make sure that the doctor who takes care of your diabetes knows about your planned surgery including the date and location.  How do I manage my blood sugar before surgery? . Check your blood sugar at least 4 times a day, starting 2 days before surgery, to make sure that the level is not too high or low. . Check your blood sugar the morning of your surgery when you wake up and every 2 hours until you get to the Short Stay unit.   . If your blood sugar is less than 70 mg/dL, you will need to treat for low blood sugar: o Do not take insulin. o Treat a low blood sugar (less than 70 mg/dL) with  cup of clear juice (cranberry or apple), 4 glucose tablets, OR glucose gel.    o Recheck blood sugar in 15 minutes after treatment (to make sure it is greater than 70 mg/dL). If your blood sugar is not greater than 70 mg/dL on recheck, call [phone]  for further instructions. . Report your blood sugar to the short stay nurse when you get to Short Stay.  . If you are admitted to the hospital after surgery: o Your blood sugar will be checked by the staff and you will probably be given insulin after surgery (instead of oral diabetes medicines) to make sure you have good blood sugar levels. o The goal for blood sugar control after surgery is 80-180 mg/dL.  WHAT DO I DO ABOUT MY DIABETES MEDICATION?   Marland Kitchen Do not take oral diabetes medicines (pills) the morning of surgery.   Other Instructions:          Patient Signature:  Date:   Nurse Signature:  Date:   Reviewed and Endorsed by [facility] Patient Education Committee, August 2015

## 2016-04-19 ENCOUNTER — Encounter (HOSPITAL_COMMUNITY)
Admission: RE | Admit: 2016-04-19 | Discharge: 2016-04-19 | Disposition: A | Discharge status: Home / Self Care | Payer: Non-veteran care | Source: Ambulatory Visit | Attending: Pediatrics | Admitting: Pediatrics

## 2016-04-19 ENCOUNTER — Encounter (HOSPITAL_COMMUNITY): Payer: Self-pay

## 2016-04-19 DIAGNOSIS — Z01812 Encounter for preprocedural laboratory examination: Secondary | ICD-10-CM | POA: Insufficient documentation

## 2016-04-19 DIAGNOSIS — Z01810 Encounter for preprocedural cardiovascular examination: Secondary | ICD-10-CM | POA: Insufficient documentation

## 2016-04-19 HISTORY — DX: Headache, unspecified: R51.9

## 2016-04-19 HISTORY — DX: Headache: R51

## 2016-04-19 HISTORY — DX: Essential (primary) hypertension: I10

## 2016-04-19 LAB — BASIC METABOLIC PANEL
Anion gap: 9 (ref 5–15)
BUN: 15 mg/dL (ref 6–20)
CHLORIDE: 100 mmol/L — AB (ref 101–111)
CO2: 29 mmol/L (ref 22–32)
CREATININE: 1.19 mg/dL (ref 0.61–1.24)
Calcium: 9.2 mg/dL (ref 8.9–10.3)
GFR calc non Af Amer: >60 mL/min (ref >60)
Glucose, Bld: 116 mg/dL — ABNORMAL HIGH (ref 65–99)
POTASSIUM: 4.3 mmol/L (ref 3.5–5.1)
Sodium: 138 mmol/L (ref 135–145)

## 2016-04-19 LAB — CBC
HEMATOCRIT: 39.5 % (ref 39.0–52.0)
Hemoglobin: 12.9 g/dL — ABNORMAL LOW (ref 13.0–17.0)
MCH: 26.5 pg (ref 26.0–34.0)
MCHC: 32.7 g/dL (ref 30.0–36.0)
MCV: 81.1 fL (ref 78.0–100.0)
Platelets: 321 10*3/uL (ref 150–400)
RBC: 4.87 MIL/uL (ref 4.22–5.81)
RDW: 16.1 % — ABNORMAL HIGH (ref 11.5–15.5)
WBC: 8.3 10*3/uL (ref 4.0–10.5)

## 2016-04-19 LAB — GLUCOSE, CAPILLARY: GLUCOSE-CAPILLARY: 116 mg/dL — AB (ref 65–99)

## 2016-04-19 NOTE — Progress Notes (Signed)
Denies any cardio issues, or tests PCP is Dr. Dell Ponto @ [facility] in [place], [place]  [place] 03/2016 He states that his most recent A1C was drawn last month.  I have requested most recent labs, and ekg. Doesn't check his sugar on daily basis.  He implies that it usually runs 113.   Have given him the MRI form to fill out and return with it on the 13th.

## 2016-04-24 ENCOUNTER — Ambulatory Visit (HOSPITAL_COMMUNITY): Payer: Non-veteran care | Admitting: Certified Registered Nurse Anesthetist

## 2016-04-24 ENCOUNTER — Ambulatory Visit (HOSPITAL_COMMUNITY)
Admission: RE | Admit: 2016-04-24 | Discharge: 2016-04-24 | Disposition: A | Discharge status: Home / Self Care | Payer: Non-veteran care | Source: Ambulatory Visit | Attending: Internal Medicine | Admitting: Internal Medicine

## 2016-04-24 ENCOUNTER — Encounter (HOSPITAL_COMMUNITY): Payer: Self-pay

## 2016-04-24 ENCOUNTER — Ambulatory Visit (HOSPITAL_COMMUNITY)
Admission: RE | Admit: 2016-04-24 | Discharge: 2016-04-24 | Disposition: A | Discharge status: Home / Self Care | Payer: Non-veteran care | Source: Ambulatory Visit | Attending: Pediatrics | Admitting: Pediatrics

## 2016-04-24 ENCOUNTER — Encounter (HOSPITAL_COMMUNITY)
Admission: RE | Disposition: A | Discharge status: Home / Self Care | Payer: Self-pay | Source: Ambulatory Visit | Attending: Pediatrics

## 2016-04-24 DIAGNOSIS — M542 Cervicalgia: Secondary | ICD-10-CM | POA: Diagnosis present

## 2016-04-24 DIAGNOSIS — F40240 Claustrophobia: Secondary | ICD-10-CM | POA: Diagnosis not present

## 2016-04-24 DIAGNOSIS — M48061 Spinal stenosis, lumbar region without neurogenic claudication: Secondary | ICD-10-CM | POA: Diagnosis not present

## 2016-04-24 DIAGNOSIS — M4802 Spinal stenosis, cervical region: Secondary | ICD-10-CM | POA: Diagnosis not present

## 2016-04-24 DIAGNOSIS — M549 Dorsalgia, unspecified: Secondary | ICD-10-CM | POA: Diagnosis not present

## 2016-04-24 HISTORY — PX: RADIOLOGY WITH ANESTHESIA: SHX6223

## 2016-04-24 LAB — GLUCOSE, CAPILLARY
Glucose-Capillary: 139 mg/dL — ABNORMAL HIGH (ref 65–99)
Glucose-Capillary: 115 mg/dL — ABNORMAL HIGH (ref 65–99)

## 2016-04-24 SURGERY — RADIOLOGY WITH ANESTHESIA
Anesthesia: General

## 2016-04-24 MED ORDER — LACTATED RINGERS IV SOLN: INTRAVENOUS | Status: DC

## 2016-04-24 MED ORDER — FENTANYL CITRATE (PF) 100 MCG/2ML IJ SOLN: 25.0000 ug | INTRAMUSCULAR | Status: DC | PRN

## 2016-04-24 MED ORDER — METOCLOPRAMIDE HCL 5 MG/ML IJ SOLN: 10.0000 mg | Freq: Once | INTRAMUSCULAR | Status: DC | PRN

## 2016-04-24 MED ORDER — MEPERIDINE HCL 25 MG/ML IJ SOLN: 6.2500 mg | INTRAMUSCULAR | Status: DC | PRN

## 2016-04-24 MED ORDER — LACTATED RINGERS IV SOLN
INTRAVENOUS | Status: DC
  Administered 2016-04-24: 08:00:00 via INTRAVENOUS

## 2016-04-24 NOTE — Anesthesia Preprocedure Evaluation (Signed)
Anesthesia Evaluation  Patient identified by MRN, date of birth, ID band Patient awake    Reviewed: Allergy & Precautions, NPO status , Patient's Chart, lab work & pertinent test results  Airway Mallampati: II  TM Distance: >3 FB Neck ROM: Full    Dental no notable dental hx.    Pulmonary neg pulmonary ROS, former smoker,    Pulmonary exam normal breath sounds clear to auscultation       Cardiovascular hypertension, Pt. on medications Normal cardiovascular exam Rhythm:Regular Rate:Normal     Neuro/Psych negative neurological ROS  negative psych ROS   GI/Hepatic negative GI ROS, Neg liver ROS,   Endo/Other  diabetes  Renal/GU negative Renal ROS  negative genitourinary   Musculoskeletal negative musculoskeletal ROS (+)   Abdominal   Peds negative pediatric ROS (+)  Hematology negative hematology ROS (+)   Anesthesia Other Findings   Reproductive/Obstetrics negative OB ROS                             Anesthesia Physical Anesthesia Plan  ASA: II  Anesthesia Plan: General   Post-op Pain Management:    Induction: Intravenous  Airway Management Planned: Oral ETT  Additional Equipment:   Intra-op Plan:   Post-operative Plan: Extubation in OR  Informed Consent: I have reviewed the patients History and Physical, chart, labs and discussed the procedure including the risks, benefits and alternatives for the proposed anesthesia with the patient or authorized representative who has indicated his/her understanding and acceptance.   Dental advisory given  Plan Discussed with: CRNA  Anesthesia Plan Comments:         Anesthesia Quick Evaluation

## 2016-04-24 NOTE — Anesthesia Postprocedure Evaluation (Signed)
Anesthesia Post Note  Patient: Keith Calhoun  Procedure(s) Performed: Procedure(s) (LRB): MRI - CERVICAL SPINE WITHOUT CONTRAST AND LUMBAR SPINE WITHOUT CONTRAST (N/A)  Patient location during evaluation: PACU Anesthesia Type: General Level of consciousness: awake and alert Pain management: pain level controlled Vital Signs Assessment: post-procedure vital signs reviewed and stable Respiratory status: spontaneous breathing, nonlabored ventilation, respiratory function stable and patient connected to nasal cannula oxygen Cardiovascular status: blood pressure returned to baseline and stable Postop Assessment: no signs of nausea or vomiting Anesthetic complications: no       Last Vitals:  Vitals:   04/24/16 1055 04/24/16 1103  BP: (!) 147/92   Pulse: 67   Resp: 10   Temp:  36.7 C    Last Pain:  Vitals:   04/24/16 1103  TempSrc:   PainSc: 0-No pain                 Montez Hageman

## 2016-04-24 NOTE — Transfer of Care (Signed)
Immediate Anesthesia Transfer of Care Note  Patient: Keith Calhoun  Procedure(s) Performed: Procedure(s): MRI - CERVICAL SPINE WITHOUT CONTRAST AND LUMBAR SPINE WITHOUT CONTRAST (N/A)  Patient Location: PACU  Anesthesia Type:General  Level of Consciousness: awake, alert , oriented and patient cooperative  Airway & Oxygen Therapy: Patient Spontanous Breathing and Patient connected to nasal cannula oxygen  Post-op Assessment: Report given to RN and Post -op Vital signs reviewed and stable  Post vital signs: Reviewed and stable  Last Vitals:  Vitals:   04/24/16 0641 04/24/16 1030  BP: 127/90   Pulse: (!) 57   Resp: 20   Temp: 37.1 C 37 C    Last Pain:  Vitals:   04/24/16 1030  TempSrc:   PainSc: 0-No pain         Complications: No apparent anesthesia complications

## 2016-04-25 ENCOUNTER — Encounter (HOSPITAL_COMMUNITY): Payer: Self-pay | Admitting: Radiology

## 2016-04-25 MED FILL — Phenylephrine HCl Inj 10 MG/ML: INTRAMUSCULAR | Qty: 10 | Status: AC

## 2016-04-25 MED FILL — Ondansetron HCl Inj 4 MG/2ML (2 MG/ML): INTRAMUSCULAR | Qty: 2 | Status: AC

## 2016-04-25 MED FILL — Fentanyl Citrate Preservative Free (PF) Inj 100 MCG/2ML: INTRAMUSCULAR | Qty: 2 | Status: AC

## 2016-04-25 MED FILL — Lidocaine HCl IV Inj 20 MG/ML: INTRAVENOUS | Qty: 5 | Status: AC

## 2016-04-25 MED FILL — Midazolam HCl Inj 2 MG/2ML (Base Equivalent): INTRAMUSCULAR | Qty: 2 | Status: AC

## 2016-04-25 MED FILL — Lactated Ringer's Solution: INTRAVENOUS | Qty: 1000 | Status: AC

## 2016-04-25 MED FILL — Propofol IV Emul 200 MG/20ML (10 MG/ML): INTRAVENOUS | Qty: 20 | Status: AC

## 2016-04-25 MED FILL — Phenylephrine-NaCl Pref Syr 0.4 MG/10ML-0.9% (40 MCG/ML): INTRAVENOUS | Qty: 10 | Status: AC

## 2016-04-25 MED FILL — Succinylcholine Chloride Inj 20 MG/ML: INTRAMUSCULAR | Qty: 6 | Status: AC

## 2016-06-29 DIAGNOSIS — M62838 Other muscle spasm: Secondary | ICD-10-CM | POA: Insufficient documentation

## 2016-06-29 DIAGNOSIS — G8929 Other chronic pain: Secondary | ICD-10-CM | POA: Insufficient documentation

## 2016-06-29 DIAGNOSIS — R200 Anesthesia of skin: Secondary | ICD-10-CM | POA: Insufficient documentation

## 2016-06-29 DIAGNOSIS — M542 Cervicalgia: Secondary | ICD-10-CM | POA: Insufficient documentation

## 2017-01-07 ENCOUNTER — Other Ambulatory Visit (HOSPITAL_COMMUNITY): Payer: Self-pay | Admitting: Internal Medicine

## 2017-01-07 DIAGNOSIS — M25551 Pain in right hip: Secondary | ICD-10-CM

## 2017-01-07 DIAGNOSIS — M1611 Unilateral primary osteoarthritis, right hip: Secondary | ICD-10-CM

## 2017-01-15 ENCOUNTER — Ambulatory Visit (HOSPITAL_COMMUNITY): Payer: Non-veteran care

## 2017-01-23 ENCOUNTER — Encounter (HOSPITAL_COMMUNITY): Payer: Self-pay | Admitting: *Deleted

## 2017-01-23 ENCOUNTER — Other Ambulatory Visit: Payer: Self-pay

## 2017-01-23 NOTE — Progress Notes (Signed)
Pt denies SOB, chest pain, and being under the care of a cardiologist.Pt denies having a stress test, echo and cardiac cath. Pt denies having a chest x ray within the last year. Pt denies recent labs. Pt made aware to stop taking vitamins and herbal medications. Pt made aware to not take Metformin DOS. Pt made aware to check BG every 2 hours prior to arrival to hospital on DOS. Pt made aware to treat a BG < 70 with 4 ounces of apple or cranberry juice, wait 15 minutes after drinking juice  to recheck BG, if BG remains < 70, call Short Stay unit to speak with a nurse. Pt verbalized understanding of all pre-op instructions.

## 2017-01-24 ENCOUNTER — Encounter (HOSPITAL_COMMUNITY): Payer: Self-pay | Admitting: *Deleted

## 2017-01-24 ENCOUNTER — Ambulatory Visit (HOSPITAL_COMMUNITY): Payer: Non-veteran care | Admitting: Certified Registered Nurse Anesthetist

## 2017-01-24 ENCOUNTER — Ambulatory Visit (HOSPITAL_COMMUNITY)
Admission: RE | Admit: 2017-01-24 | Discharge: 2017-01-24 | Disposition: A | Discharge status: Home / Self Care | Payer: Non-veteran care | Source: Ambulatory Visit | Attending: Internal Medicine | Admitting: Internal Medicine

## 2017-01-24 ENCOUNTER — Encounter (HOSPITAL_COMMUNITY)
Admission: RE | Disposition: A | Discharge status: Home / Self Care | Payer: Self-pay | Source: Ambulatory Visit | Attending: Internal Medicine

## 2017-01-24 DIAGNOSIS — M1611 Unilateral primary osteoarthritis, right hip: Secondary | ICD-10-CM

## 2017-01-24 DIAGNOSIS — M5489 Other dorsalgia: Secondary | ICD-10-CM | POA: Insufficient documentation

## 2017-01-24 DIAGNOSIS — I10 Essential (primary) hypertension: Secondary | ICD-10-CM | POA: Insufficient documentation

## 2017-01-24 DIAGNOSIS — Z79899 Other long term (current) drug therapy: Secondary | ICD-10-CM | POA: Diagnosis not present

## 2017-01-24 DIAGNOSIS — E119 Type 2 diabetes mellitus without complications: Secondary | ICD-10-CM | POA: Insufficient documentation

## 2017-01-24 DIAGNOSIS — M25551 Pain in right hip: Secondary | ICD-10-CM | POA: Diagnosis not present

## 2017-01-24 DIAGNOSIS — M542 Cervicalgia: Secondary | ICD-10-CM | POA: Insufficient documentation

## 2017-01-24 DIAGNOSIS — D1779 Benign lipomatous neoplasm of other sites: Secondary | ICD-10-CM | POA: Diagnosis not present

## 2017-01-24 DIAGNOSIS — M1612 Unilateral primary osteoarthritis, left hip: Secondary | ICD-10-CM | POA: Diagnosis not present

## 2017-01-24 HISTORY — DX: Other chronic pain: G89.29

## 2017-01-24 HISTORY — PX: RADIOLOGY WITH ANESTHESIA: SHX6223

## 2017-01-24 LAB — BASIC METABOLIC PANEL
Anion gap: 10 (ref 5–15)
BUN: 14 mg/dL (ref 6–20)
CHLORIDE: 108 mmol/L (ref 101–111)
CO2: 22 mmol/L (ref 22–32)
Calcium: 9.3 mg/dL (ref 8.9–10.3)
Creatinine, Ser: 1.08 mg/dL (ref 0.61–1.24)
GFR calc Af Amer: >60 mL/min (ref >60)
GFR calc non Af Amer: >60 mL/min (ref >60)
GLUCOSE: 112 mg/dL — AB (ref 65–99)
POTASSIUM: 4.2 mmol/L (ref 3.5–5.1)
Sodium: 140 mmol/L (ref 135–145)

## 2017-01-24 LAB — CBC
HCT: 38 % — ABNORMAL LOW (ref 39.0–52.0)
HEMOGLOBIN: 12.6 g/dL — AB (ref 13.0–17.0)
MCH: 27 pg (ref 26.0–34.0)
MCHC: 33.2 g/dL (ref 30.0–36.0)
MCV: 81.4 fL (ref 78.0–100.0)
PLATELETS: 316 10*3/uL (ref 150–400)
RBC: 4.67 MIL/uL (ref 4.22–5.81)
RDW: 17 % — ABNORMAL HIGH (ref 11.5–15.5)
WBC: 9 10*3/uL (ref 4.0–10.5)

## 2017-01-24 LAB — GLUCOSE, CAPILLARY: GLUCOSE-CAPILLARY: 96 mg/dL (ref 65–99)

## 2017-01-24 SURGERY — MRI WITH ANESTHESIA
Anesthesia: General | Laterality: Right

## 2017-01-24 MED ORDER — PROPOFOL 10 MG/ML IV BOLUS
INTRAVENOUS | Status: DC | PRN
  Administered 2017-01-24: 200 mg via INTRAVENOUS

## 2017-01-24 MED ORDER — ROCURONIUM BROMIDE 100 MG/10ML IV SOLN
INTRAVENOUS | Status: DC | PRN
  Administered 2017-01-24: 50 mg via INTRAVENOUS

## 2017-01-24 MED ORDER — SUGAMMADEX SODIUM 500 MG/5ML IV SOLN
INTRAVENOUS | Status: DC | PRN
  Administered 2017-01-24: 300 mg via INTRAVENOUS

## 2017-01-24 MED ORDER — LACTATED RINGERS IV SOLN
INTRAVENOUS | Status: DC
  Administered 2017-01-24: 10:00:00 via INTRAVENOUS

## 2017-01-24 MED ORDER — MIDAZOLAM HCL 2 MG/2ML IJ SOLN
INTRAMUSCULAR | Status: DC | PRN
  Administered 2017-01-24 (×2): 1 mg via INTRAVENOUS

## 2017-01-24 MED ORDER — FENTANYL CITRATE (PF) 250 MCG/5ML IJ SOLN
INTRAMUSCULAR | Status: DC | PRN
  Administered 2017-01-24: 100 ug via INTRAVENOUS

## 2017-01-24 NOTE — Transfer of Care (Signed)
Immediate Anesthesia Transfer of Care Note  Patient: Keith Calhoun  Procedure(s) Performed: MRI RIGHT HIP WITHOUT (Right )  Patient Location: PACU  Anesthesia Type:General  Level of Consciousness: awake, alert  and oriented  Airway & Oxygen Therapy: Patient Spontanous Breathing and Patient connected to nasal cannula oxygen  Post-op Assessment: Report given to RN and Post -op Vital signs reviewed and stable  Post vital signs: Reviewed and stable  Last Vitals:  Vitals:   01/24/17 0929 01/24/17 1240  BP: (!) 133/92 124/74  Pulse: (!) 47   Resp: 20 16  Temp: 36.8 C 36.7 C  SpO2: 96% 98%    Last Pain:  Vitals:   01/24/17 0929  TempSrc: Oral      Patients Stated Pain Goal: 6 (98/02/21 7981)  Complications: No apparent anesthesia complications

## 2017-01-24 NOTE — Anesthesia Postprocedure Evaluation (Signed)
Anesthesia Post Note  Patient: Keith Calhoun  Procedure(s) Performed: MRI RIGHT HIP WITHOUT (Right )     Patient location during evaluation: PACU Anesthesia Type: General Level of consciousness: awake and alert Pain management: pain level controlled Vital Signs Assessment: post-procedure vital signs reviewed and stable Respiratory status: spontaneous breathing, nonlabored ventilation and respiratory function stable Cardiovascular status: blood pressure returned to baseline and stable Postop Assessment: no apparent nausea or vomiting Anesthetic complications: no    Last Vitals:  Vitals:   01/24/17 0929 01/24/17 1240  BP: (!) 133/92 124/74  Pulse: (!) 47   Resp: 20 16  Temp: 36.8 C 36.7 C  SpO2: 96% 98%    Last Pain:  Vitals:   01/24/17 0929  TempSrc: Oral                 Lynda Rainwater

## 2017-01-24 NOTE — Anesthesia Preprocedure Evaluation (Addendum)
Anesthesia Evaluation  Patient identified by MRN, date of birth, ID band Patient awake    Reviewed: Allergy & Precautions, NPO status , Patient's Chart, lab work & pertinent test results  Airway Mallampati: II  TM Distance: >3 FB Neck ROM: Full    Dental no notable dental hx.    Pulmonary neg pulmonary ROS, former smoker,    Pulmonary exam normal breath sounds clear to auscultation       Cardiovascular hypertension, Pt. on medications Normal cardiovascular exam Rhythm:Regular Rate:Normal     Neuro/Psych negative neurological ROS  negative psych ROS   GI/Hepatic negative GI ROS, Neg liver ROS,   Endo/Other  diabetes  Renal/GU negative Renal ROS  negative genitourinary   Musculoskeletal negative musculoskeletal ROS (+)   Abdominal   Peds negative pediatric ROS (+)  Hematology negative hematology ROS (+)   Anesthesia Other Findings   Reproductive/Obstetrics negative OB ROS                             Anesthesia Physical  Anesthesia Plan  ASA: III  Anesthesia Plan: General   Post-op Pain Management:    Induction: Intravenous  PONV Risk Score and Plan: 2 and Ondansetron, Midazolam and Treatment may vary due to age or medical condition  Airway Management Planned: Oral ETT and LMA  Additional Equipment:   Intra-op Plan:   Post-operative Plan: Extubation in OR  Informed Consent: I have reviewed the patients History and Physical, chart, labs and discussed the procedure including the risks, benefits and alternatives for the proposed anesthesia with the patient or authorized representative who has indicated his/her understanding and acceptance.   Dental advisory given  Plan Discussed with: CRNA  Anesthesia Plan Comments:        Anesthesia Quick Evaluation

## 2017-01-24 NOTE — Anesthesia Procedure Notes (Signed)
Procedure Name: Intubation Date/Time: 01/24/2017 11:35 AM Performed by: Bryson Corona, CRNA Pre-anesthesia Checklist: Patient identified, Emergency Drugs available, Suction available and Patient being monitored Patient Re-evaluated:Patient Re-evaluated prior to induction Oxygen Delivery Method: Circle System Utilized Preoxygenation: Pre-oxygenation with 100% oxygen Induction Type: IV induction Ventilation: Mask ventilation without difficulty Laryngoscope Size: Mac and 4 Grade View: Grade II Tube type: Oral Number of attempts: 1 Airway Equipment and Method: Stylet and Oral airway Placement Confirmation: ETT inserted through vocal cords under direct vision,  positive ETCO2 and breath sounds checked- equal and bilateral Secured at: 22 cm Tube secured with: Tape Dental Injury: Teeth and Oropharynx as per pre-operative assessment

## 2017-01-25 ENCOUNTER — Encounter (HOSPITAL_COMMUNITY): Payer: Self-pay | Admitting: Radiology

## 2017-02-05 MED FILL — Ephedrine Sulf-NaCl Soln Pref Syr 50 MG/10ML-0.9% (5 MG/ML): INTRAVENOUS | Qty: 10 | Status: AC

## 2017-02-05 MED FILL — Dexamethasone Sodium Phosphate Inj 10 MG/ML: INTRAMUSCULAR | Qty: 1 | Status: AC

## 2017-02-05 MED FILL — Ondansetron HCl Inj 4 MG/2ML (2 MG/ML): INTRAMUSCULAR | Qty: 2 | Status: AC

## 2017-02-05 MED FILL — Lactated Ringer's Solution: INTRAVENOUS | Qty: 1000 | Status: AC

## 2017-07-09 DIAGNOSIS — Z96641 Presence of right artificial hip joint: Secondary | ICD-10-CM | POA: Insufficient documentation

## 2017-09-10 ENCOUNTER — Ambulatory Visit (INDEPENDENT_AMBULATORY_CARE_PROVIDER_SITE_OTHER): Payer: No Typology Code available for payment source | Admitting: Neurology

## 2017-09-10 ENCOUNTER — Encounter: Payer: Self-pay | Admitting: Neurology

## 2017-09-10 VITALS — BP 142/82 | HR 87 | Ht 74.0 in | Wt 282.0 lb

## 2017-09-10 DIAGNOSIS — G250 Essential tremor: Secondary | ICD-10-CM | POA: Diagnosis not present

## 2017-09-10 DIAGNOSIS — G43709 Chronic migraine without aura, not intractable, without status migrainosus: Secondary | ICD-10-CM | POA: Diagnosis not present

## 2017-09-10 MED ORDER — TOPIRAMATE 50 MG PO TABS
50.0000 mg | ORAL_TABLET | Freq: Every day | ORAL | 2 refills | Status: DC
Start: 2017-09-10 — End: 2018-07-16

## 2017-09-10 NOTE — Patient Instructions (Signed)
  1.  Start topiramate 50mg  at bedtime.  If headaches not improved in 4 weeks, contact me with update and we can adjust dose if needed. 2.  Take zolmitriptan 5mg  at earliest onset of headache.  May repeat dose once in 2 hours if needed.  Do not exceed two tablets in 24 hours. 3.  Try to stop tylenol.  Limit use of pain relievers to no more than 2 days out of the week.  These medications include acetaminophen, ibuprofen, triptans and narcotics.  This will help reduce risk of rebound headaches. 4.  Be aware of common food triggers such as processed sweets, processed foods with nitrites (such as deli meat, hot dogs, sausages), foods with MSG, alcohol (such as wine), chocolate, certain cheeses, certain fruits (dried fruits, bananas, some citrus fruit), vinegar, diet soda. 4.  Avoid caffeine 5.  Routine exercise 6.  Proper sleep hygiene 7.  Stay adequately hydrated with water 8.  Keep a headache diary. 9.  Maintain proper stress management. 10.  Do not skip meals. 11.  Consider supplements:  Magnesium citrate 400mg  to 600mg  daily, riboflavin 400mg , Coenzyme Q 10 100mg  three times daily 12.  Follow up in 3 months

## 2017-09-10 NOTE — Progress Notes (Signed)
NEUROLOGY CONSULTATION NOTE  [facility] MRN: [id number] DOB: 10-28-1955  Referring provider: Dr. Ronnald Ramp Primary care provider: Dr. Henrene Pastor  Reason for consult:  migraines  HISTORY OF PRESENT ILLNESS: Keith Calhoun is a 62 year old right-handed male with hypertension, type 2 diabetes, hyperlipidemia, chronic neck and back pain, GERD, asthma who presents for migraines.  History supplemented by referring provider's note.  Onset:  Since age 68-55 years old Location:  Right temporal Quality:  Throbbing/pressure Intensity:  8/10.  He denies thunderclap headache or severe headache.  He may wake up with headache (as they often occur at bedtime) but headaches themselves do not wake him up. Aura:  no Prodrome:  no Postdrome:  no Associated symptoms:  Nausea, photophobia.  No vomiting, phonophobia or visual disturbance.  He denies associated unilateral numbness or weakness. Duration:  30 minutes with Zomig 5mg , otherwise several hours Frequency:  Twice daily, always at night. Frequency of abortive medication: 5 to 7 days a week Triggers/exacerbating factors:  anxiety Relieving factors:  ice Activity:  aggravates  Rescue medication:  Zomig 5mg  or Tylenol Current NSAIDS:  ASA 325mg  Current analgesics:  acetaminophen Current triptans:  zolmitriptan 5mg  tablet Current anti-emetic:  no Current muscle relaxants:  no Current anti-anxiolytic:  no Current sleep aide:  no Current Antihypertensive medications:  lisinopril Current Antidepressant medications:  no Current Anticonvulsant medications:  gabapentin 300mg  three times daily Current Vitamins/Herbal/Supplements:  no Current Antihistamines/Decongestants:  no Other therapy:  no  Past NSAIDS:  ibuprofen, Celebrex, naproxen 500mg  Past analgesics:  tramadol, hydrocodone, tramadol Past abortive triptans:  no Past muscle relaxants:  no Past anti-emetic:  no Past antihypertensive medications:  no Past antidepressant medications:   amitriptyline 25mg  (took briefly for a few days to help with sleep) Past anticonvulsant medications:  no Past vitamins/Herbal/Supplements:  no Past antihistamines/decongestants:  no Other past therapies:  no  Caffeine:  no Alcohol:  rarely Smoker:  no Diet:  hydrates Depression:  no; Anxiety:  Yes because of headaches Other pain:  History of chronic neck and back pain.  Cervical XR from 06/29/16 demonstrated multilevel degenerative changes most pronounced at C4-5 through C6-7.  Lumbar XR from 06/29/16 showed multilevel degenerative changes with facet hypertrophy and intervertebral disc height loss worst at L5-S1. Sleep hygiene:  poor.  Trouble falling asleep and staying asleep, often due to ongoing generalized aches and pain. Family history of headache:  No  06/07/17 Labs:  Na 141, K 4.1, glucose 108, Cr 1.070, t. bili 0.4, ALT 17, AST 14.  PAST MEDICAL HISTORY: Past Medical History:  Diagnosis Date  . Anemia    2012- turned down for donating blood, due to low count   . Anxiety    claustrophobia   . Arthritis    L knee , L shoudler   . Bronchitis   . Chronic pain   . Diabetes mellitus    dx 2007  . GERD (gastroesophageal reflux disease)   . Headache    ?? stress   . Hemorrhoids   . Hyperlipidemia   . Hypertension   . 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 HEMORRHOID SURGERY  04/2011  . HEMORRHOID SURGERY  05/09/2011   Procedure: HEMORRHOIDECTOMY;  Surgeon: Imogene Burn. Georgette Dover, MD;  Location: [facility];  Service: General;  Laterality: N/A;  . [place]   left  . RADIOLOGY WITH  ANESTHESIA N/A 04/24/2016   Procedure: MRI - CERVICAL SPINE WITHOUT CONTRAST AND LUMBAR SPINE WITHOUT CONTRAST;  Surgeon: Medication Radiologist, MD;  Location: [facility];  Service: Radiology;  Laterality: N/A;  . RADIOLOGY WITH ANESTHESIA Right 01/24/2017   Procedure: MRI RIGHT HIP  WITHOUT;  Surgeon: Radiologist, Medication, MD;  Location: [facility];  Service: Radiology;  Laterality: Right;  . SHOULDER ARTHROSCOPY DISTAL CLAVICLE EXCISION AND OPEN ROTATOR CUFF REPAIR     L shoulder   . TONSILLECTOMY     as a child    MEDICATIONS: Current Outpatient Medications on File Prior to Visit  Medication Sig Dispense Refill  . acetaminophen (TYLENOL) 500 MG tablet Take 2 tablets by mouth every 6 (six) hours as needed.    Marland Kitchen aspirin EC 325 MG tablet Take 1 tablet by mouth daily.    Marland Kitchen gabapentin (NEURONTIN) 300 MG capsule Take 300 mg by mouth 3 (three) times daily.    . Multiple Vitamin (MULTIVITAMIN) tablet Take 1 tablet by mouth daily.    Marland Kitchen senna-docusate (SENOKOT-S) 8.6-50 MG tablet Take 2 tablets by mouth 2 (two) times daily.    Marland Kitchen albuterol (PROVENTIL HFA;VENTOLIN HFA) 108 (90 Base) MCG/ACT inhaler Inhale 2 puffs every 6 (six) hours as needed into the lungs for wheezing or shortness of breath.     . benzonatate (TESSALON) 100 MG capsule Take 100 mg by mouth 2 (two) times daily.    . clindamycin (CLEOCIN T) 1 % external solution Apply 1 application daily as needed topically (for after shaving).    Marland Kitchen latanoprost (XALATAN) 0.005 % ophthalmic solution Place 1 drop into both eyes at bedtime.    Marland Kitchen lisinopril (PRINIVIL,ZESTRIL) 40 MG tablet Take 40 mg by mouth at bedtime.    Marland Kitchen loratadine (CLARITIN) 10 MG tablet Take 10 mg by mouth daily.    . meloxicam (MOBIC) 15 MG tablet Take 15 mg daily by mouth. Pt takes it as needed    . metFORMIN (GLUCOPHAGE XR) 750 MG 24 hr tablet Take 750 mg by mouth 2 (two) times daily.    Marland Kitchen oxymetazoline (AFRIN) 0.05 % nasal spray Place 1 spray 2 (two) times daily as needed into both nostrils for congestion.    . pravastatin (PRAVACHOL) 10 MG tablet Take 10 mg by mouth at bedtime.    . ranitidine (ZANTAC) 150 MG tablet Take 150 mg by mouth daily.    Marland Kitchen VIAGRA 100 MG tablet Take 100 mg by mouth daily as needed for erectile dysfunction.     Marland Kitchen zolmitriptan (ZOMIG)  5 MG tablet Take 5 mg by mouth as needed.     No current facility-administered medications on file prior to visit.     ALLERGIES: No Known Allergies  FAMILY HISTORY: Family History  Problem Relation Age of Onset  . Heart disease Father   . Blindness Mother   . Anesthesia problems Neg Hx   . Hypotension Neg Hx   . Malignant hyperthermia Neg Hx   . Pseudochol deficiency Neg Hx     SOCIAL HISTORY: Social History   Socioeconomic History  . Marital status: Divorced    Spouse name: Not on file  . Number of children: 2  . Years of education: Not on file  . Highest education level: Some college, no degree  Occupational History    Employer: [employer]    Comment: FMLA  leave  Social Needs  . Financial resource strain: Not on file  . Food insecurity:    Worry: Not on file  Inability: Not on file  . Transportation needs:    Medical: Not on file    Non-medical: Not on file  Tobacco Use  . Smoking status: Former Smoker    Types: Cigarettes    Last attempt to quit: 03/13/2007    Years since quitting: 10.5  . Smokeless tobacco: Never Used  Substance and Sexual Activity  . Alcohol use: Yes    Comment: rare  . Drug use: No  . Sexual activity: Not on file  Lifestyle  . Physical activity:    Days per week: Not on file    Minutes per session: Not on file  . Stress: Not on file  Relationships  . Social connections:    Talks on phone: Not on file    Gets together: Not on file    Attends religious service: Not on file    Active member of club or organization: Not on file    Attends meetings of clubs or organizations: Not on file    Relationship status: Not on file  . Intimate partner violence:    Fear of current or ex partner: Not on file    Emotionally abused: Not on file    Physically abused: Not on file    Forced sexual activity: Not on file  Other Topics Concern  . Not on file  Social History Narrative   Patient is right-handed. He takes care of his mother, she lives with  him in a one story house. He is currently in P/T 3 x week. He drinks tea 3 x week.    REVIEW OF SYSTEMS: Constitutional: No fevers, chills, or sweats, no generalized fatigue, change in appetite Eyes: No visual changes, double vision, eye pain Ear, nose and throat: No hearing loss, ear pain, nasal congestion, sore throat Cardiovascular: No chest pain, palpitations Respiratory:  No shortness of breath at rest or with exertion, wheezes GastrointestinaI: No nausea, vomiting, diarrhea, abdominal pain, fecal incontinence Genitourinary:  No dysuria, urinary retention or frequency Musculoskeletal:  No neck pain, back pain Integumentary: No rash, pruritus, skin lesions Neurological: as above Psychiatric: No depression, insomnia, anxiety Endocrine: No palpitations, fatigue, diaphoresis, mood swings, change in appetite, change in weight, increased thirst Hematologic/Lymphatic:  No purpura, petechiae. Allergic/Immunologic: no itchy/runny eyes, nasal congestion, recent allergic reactions, rashes  PHYSICAL EXAM: Vitals:   09/10/17 1456  BP: (!) 142/82  Pulse: 87  SpO2: 96%   General: No acute distress.  Patient appears well-groomed.  Head:  Normocephalic/atraumatic Eyes:  fundi examined but not visualized Neck: supple, no paraspinal tenderness, full range of motion Back: No paraspinal tenderness Heart: regular rate and rhythm Lungs: Clear to auscultation bilaterally. Vascular: No carotid bruits. Neurological Exam: Mental status: alert and oriented to person, place, and time, recent and remote memory intact, fund of knowledge intact, attention and concentration intact, speech fluent and not dysarthric, language intact. Cranial nerves: CN I: not tested CN II: pupils equal, round and reactive to light, visual fields intact CN III, IV, VI:  full range of motion, no nystagmus, no ptosis CN V: facial sensation intact CN VII: upper and lower face symmetric CN VIII: hearing intact CN IX, X: gag  intact, uvula midline CN XI: sternocleidomastoid and trapezius muscles intact CN XII: tongue midline Bulk & Tone: normal, no fasciculations. Motor:  5/5 throughout  Sensation: temperature and vibration sensation intact. Deep Tendon Reflexes:  2+ throughout, toes downgoing.  Finger to nose testing:  Fine postural and kinetic tremor.  Without dysmetria.  Heel to shin:  Without dysmetria.  Gait:  Right antalgic gait (recent right hip replacement).  Able to turn but not tandem walk. Romberg negative.  IMPRESSION: Chronic migraine without aura, without status migrainosus, not intractable Essential tremor  PLAN: 1.  Start topiramate 50mg  at bedtime for migraine prevention (also may help with tremor as well).  If headaches not improved in 4 weeks, contact me with update and we can adjust dose if needed. 2.  Take zolmitriptan 5mg  at earliest onset of headache.  May repeat dose once in 2 hours if needed.  Do not exceed two tablets in 24 hours. 3.  Try to stop tylenol.  Limit use of pain relievers to no more than 2 days out of the week.  These medications include acetaminophen, ibuprofen, triptans and narcotics.  This will help reduce risk of rebound headaches. 4.  Be aware of common food triggers such as processed sweets, processed foods with nitrites (such as deli meat, hot dogs, sausages), foods with MSG, alcohol (such as wine), chocolate, certain cheeses, certain fruits (dried fruits, bananas, some citrus fruit), vinegar, diet soda. 4.  Avoid caffeine 5.  Routine exercise 6.  Proper sleep hygiene 7.  Stay adequately hydrated with water 8.  Keep a headache diary. 9.  Maintain proper stress management. 10.  Do not skip meals. 11.  Consider supplements:  Magnesium citrate 400mg  to 600mg  daily, riboflavin 400mg , Coenzyme Q 10 100mg  three times daily 12.  Follow up in 3 months  Thank you for allowing me to take part in the care of this patient.  Metta Clines, DO  CC:  Dell Ponto,  MD  Landry Mellow, MD

## 2017-09-17 ENCOUNTER — Telehealth: Payer: Self-pay | Admitting: Neurology

## 2017-09-17 NOTE — Telephone Encounter (Signed)
[facility] called back to say that the [facility] pharmacy will fill these supplements all we need to do is fax prescriptions to [facility] at [phone]

## 2017-09-17 NOTE — Telephone Encounter (Signed)
Keith Calhoun Self [phone]    Magnesium citrate 400mg  to 600mg  daily, riboflavin 400mg , Coenzyme Q 10 100mg  three times daily   [name] called to see if we were going to do prescriptions for the supplements that Dr Tomi Likens had suggested. He stated that he wanted to be sure that the [facility] was going to pay for these, I advised him to check with them and see if he needed prescriptions for them to cover or if they would furnish thru their pharmacy without prescriptions. He is going to check and call back.

## 2017-09-17 NOTE — Telephone Encounter (Signed)
For future reference all his prescriptions should go to the [facility], they fill his medications for free.

## 2017-09-23 ENCOUNTER — Other Ambulatory Visit: Payer: Self-pay

## 2017-09-23 DIAGNOSIS — G43709 Chronic migraine without aura, not intractable, without status migrainosus: Secondary | ICD-10-CM

## 2017-09-23 MED ORDER — RIBOFLAVIN 400 MG PO TABS: 1.0000 | ORAL_TABLET | Freq: Every day | ORAL | 3 refills | Status: DC

## 2017-09-23 MED ORDER — MAGNESIUM 400 MG PO TABS: 1.0000 | ORAL_TABLET | Freq: Every day | ORAL | 3 refills | Status: DC

## 2017-09-23 MED ORDER — COENZYME Q10 100 MG PO TABS: 1.0000 | ORAL_TABLET | Freq: Three times a day (TID) | ORAL | 3 refills | Status: DC

## 2017-09-26 ENCOUNTER — Telehealth: Payer: Self-pay | Admitting: Neurology

## 2017-09-26 NOTE — Telephone Encounter (Signed)
Patient called and needs to let you know that he has been Nauseated from the medication he has been taking. Please Call. Thanks

## 2017-09-26 NOTE — Telephone Encounter (Signed)
Calle and LMOVM for Pt to rtrn my call

## 2017-10-04 NOTE — Telephone Encounter (Signed)
Called Pt, LMOVM, advising since I have not rcvd a call back, I am closing encounter

## 2017-10-21 ENCOUNTER — Telehealth: Payer: Self-pay

## 2017-10-21 DIAGNOSIS — G43709 Chronic migraine without aura, not intractable, without status migrainosus: Secondary | ICD-10-CM

## 2017-10-21 NOTE — Telephone Encounter (Signed)
Called and spoke with Pt, advised him of nortriptyline. Pt to call back with fax number to [facility] in [place]

## 2017-10-21 NOTE — Telephone Encounter (Signed)
Called Pt to let him know to start nortriptyline 10mg . Pt requests Rx be faxed to [facility]. Pt will call back with fax#

## 2017-10-21 NOTE — Telephone Encounter (Signed)
The topiramate was primarily prescribed to treat his migraine (which was the primary reason for his referral).  It happens to be effective for tremor as well.  I would like to start nortriptyline 10mg  at bedtime.  This will help reduce frequency of his migraines but will not treat his tremor.  I can't start two medications at the same time due to potential side effects, so we will treat the migraines for now.  Side effects may include dizziness and sleepiness (which is why he takes it at bedtime) or dry mouth (but he should keep hydrated with water anyway).  We can increase dose in 4 weeks if headaches not improved.

## 2017-10-21 NOTE — Telephone Encounter (Signed)
Pt called concerning topiramate. He attempted to wait out the side effects of nausea because it helped his tremor significantly, but has now d/c.

## 2017-10-22 MED ORDER — NORTRIPTYLINE HCL 10 MG PO CAPS: 10.0000 mg | ORAL_CAPSULE | Freq: Every day | ORAL | 3 refills | Status: DC

## 2017-10-22 NOTE — Telephone Encounter (Signed)
Pt called, LM on my VM with the fax# [phone] for the [facility]. He said I must include his SS# on the fax SS# [id number]

## 2017-10-22 NOTE — Addendum Note (Signed)
Addended by: Clois Comber on: 10/22/2017 05:05 PM   Modules accepted: Orders

## 2017-11-01 ENCOUNTER — Telehealth: Payer: Self-pay | Admitting: Neurology

## 2017-11-01 NOTE — Telephone Encounter (Signed)
Called and spoke with Pt. He states his neck has been bothering him, and has DDD, feels it may be contributing to his headaches. [facility] I advised him to first try increasing nortriptyline to 20 mg QHS and try Biofreeze or icy hot on his neck to see if that helps. Pt is agreeable and will call in 4 weeks if headaches have not improved.

## 2017-11-01 NOTE — Telephone Encounter (Signed)
Patient seen last month as a new patient. He said that his headaches are not any better. Please Call. Thanks

## 2017-11-04 ENCOUNTER — Ambulatory Visit: Payer: Non-veteran care | Admitting: Neurology

## 2017-11-12 ENCOUNTER — Telehealth: Payer: Self-pay | Admitting: Neurology

## 2017-11-12 NOTE — Telephone Encounter (Signed)
I agree that his neck is contributing.  His neck pain is chronic.  Does he have somebody to treat the neck pain already?  Otherwise, he may need to go to pain management

## 2017-11-12 NOTE — Telephone Encounter (Signed)
Patient called stating he wasn't getting much sleep. He didn't know if it was due to his Neck injury. He wakes up at 3am every morning. He thinks that the Neck injury is  causing his migraines. When he is Laying down he tends to get a migraine but during the day standing up he is fine. He was wanting to let the doctor know and see if there was any advice. Please call him back at [phone]. Thanks.

## 2017-11-12 NOTE — Telephone Encounter (Signed)
Please advise 

## 2017-11-13 NOTE — Telephone Encounter (Signed)
Called and spoke with Pt. [facility] to [facility]. He will forward that report to [place]. I asked that he also request the CD to be sent to [place]. Pt will wait until after MRI to decide any treatment. Neck is feeling better today.

## 2017-11-13 NOTE — Telephone Encounter (Signed)
Please call.

## 2017-11-14 ENCOUNTER — Telehealth: Payer: Self-pay | Admitting: Neurology

## 2017-11-14 NOTE — Telephone Encounter (Signed)
Patient called and is needing to have paperwork faxed to [facility] regarding MRI for [facility]? He said the [place] has already faxed their [facility]. He said he is reaching his deadline. Thanks

## 2017-11-15 ENCOUNTER — Other Ambulatory Visit (HOSPITAL_COMMUNITY): Payer: Self-pay | Admitting: Family Medicine

## 2017-11-15 ENCOUNTER — Other Ambulatory Visit (HOSPITAL_COMMUNITY): Payer: Self-pay | Admitting: General Practice

## 2017-11-15 DIAGNOSIS — G4452 New daily persistent headache (NDPH): Secondary | ICD-10-CM

## 2017-11-15 DIAGNOSIS — G44021 Chronic cluster headache, intractable: Secondary | ICD-10-CM

## 2017-11-15 NOTE — Telephone Encounter (Signed)
Called and spoke with Pt. [facility] Is handling and will schedule for him.

## 2017-11-25 ENCOUNTER — Other Ambulatory Visit: Payer: Self-pay

## 2017-11-25 ENCOUNTER — Emergency Department (HOSPITAL_COMMUNITY)
Admission: EM | Admit: 2017-11-25 | Discharge: 2017-11-25 | Disposition: A | Discharge status: Home / Self Care | Payer: Non-veteran care | Attending: Emergency Medicine | Admitting: Emergency Medicine

## 2017-11-25 DIAGNOSIS — Z5321 Procedure and treatment not carried out due to patient leaving prior to being seen by health care provider: Secondary | ICD-10-CM | POA: Diagnosis not present

## 2017-11-25 DIAGNOSIS — R42 Dizziness and giddiness: Secondary | ICD-10-CM | POA: Diagnosis present

## 2017-11-25 LAB — BASIC METABOLIC PANEL
Anion gap: 12 (ref 5–15)
BUN: 20 mg/dL (ref 8–23)
CO2: 26 mmol/L (ref 22–32)
Calcium: 9 mg/dL (ref 8.9–10.3)
Chloride: 101 mmol/L (ref 98–111)
Creatinine, Ser: 1.33 mg/dL — ABNORMAL HIGH (ref 0.61–1.24)
GFR calc Af Amer: >60 mL/min (ref >60)
GFR calc non Af Amer: 55 mL/min — ABNORMAL LOW (ref >60)
Glucose, Bld: 151 mg/dL — ABNORMAL HIGH (ref 70–99)
Potassium: 4.3 mmol/L (ref 3.5–5.1)
Sodium: 139 mmol/L (ref 135–145)

## 2017-11-25 LAB — CBC
HCT: 36.8 % — ABNORMAL LOW (ref 39.0–52.0)
Hemoglobin: 11.8 g/dL — ABNORMAL LOW (ref 13.0–17.0)
MCH: 26.3 pg (ref 26.0–34.0)
MCHC: 32.1 g/dL (ref 30.0–36.0)
MCV: 82 fL (ref 78.0–100.0)
Platelets: 371 10*3/uL (ref 150–400)
RBC: 4.49 MIL/uL (ref 4.22–5.81)
RDW: 16.6 % — ABNORMAL HIGH (ref 11.5–15.5)
WBC: 8.9 10*3/uL (ref 4.0–10.5)

## 2017-11-25 NOTE — ED Notes (Signed)
Pt stated he has to work in the morning and needs to leave. Pt left

## 2017-11-25 NOTE — ED Triage Notes (Signed)
Pt reports migraine x 1.5 years, has MRI scheduled, recently started taking nortriptyline. Pt reports worsening with laying down. Pt reports dizziness with standing . Pt reports recently returning to work after hip replacement in April. Pt reports he has not been sleeping. Pt reports dry, non-productive cough.

## 2017-11-28 ENCOUNTER — Emergency Department (HOSPITAL_COMMUNITY)
Admission: EM | Admit: 2017-11-28 | Discharge: 2017-11-28 | Disposition: A | Discharge status: Home / Self Care | Payer: No Typology Code available for payment source | Attending: Emergency Medicine | Admitting: Emergency Medicine

## 2017-11-28 ENCOUNTER — Emergency Department (HOSPITAL_COMMUNITY): Payer: No Typology Code available for payment source

## 2017-11-28 ENCOUNTER — Encounter (HOSPITAL_COMMUNITY): Payer: Self-pay | Admitting: Emergency Medicine

## 2017-11-28 ENCOUNTER — Other Ambulatory Visit: Payer: Self-pay

## 2017-11-28 DIAGNOSIS — M5412 Radiculopathy, cervical region: Secondary | ICD-10-CM | POA: Insufficient documentation

## 2017-11-28 DIAGNOSIS — Z79899 Other long term (current) drug therapy: Secondary | ICD-10-CM | POA: Insufficient documentation

## 2017-11-28 DIAGNOSIS — I10 Essential (primary) hypertension: Secondary | ICD-10-CM | POA: Insufficient documentation

## 2017-11-28 DIAGNOSIS — Z7982 Long term (current) use of aspirin: Secondary | ICD-10-CM | POA: Insufficient documentation

## 2017-11-28 DIAGNOSIS — M542 Cervicalgia: Secondary | ICD-10-CM | POA: Diagnosis present

## 2017-11-28 DIAGNOSIS — Z87891 Personal history of nicotine dependence: Secondary | ICD-10-CM | POA: Insufficient documentation

## 2017-11-28 DIAGNOSIS — E119 Type 2 diabetes mellitus without complications: Secondary | ICD-10-CM | POA: Insufficient documentation

## 2017-11-28 LAB — CBC WITH DIFFERENTIAL/PLATELET
ABS IMMATURE GRANULOCYTES: 0 10*3/uL (ref 0.0–0.1)
BASOS ABS: 0 10*3/uL (ref 0.0–0.1)
Basophils Relative: 0 %
EOS ABS: 0.1 10*3/uL (ref 0.0–0.7)
Eosinophils Relative: 2 %
HCT: 37.7 % — ABNORMAL LOW (ref 39.0–52.0)
Hemoglobin: 11.9 g/dL — ABNORMAL LOW (ref 13.0–17.0)
Immature Granulocytes: 0 %
Lymphocytes Relative: 29 %
Lymphs Abs: 2.8 10*3/uL (ref 0.7–4.0)
MCH: 26 pg (ref 26.0–34.0)
MCHC: 31.6 g/dL (ref 30.0–36.0)
MCV: 82.5 fL (ref 78.0–100.0)
MONO ABS: 0.7 10*3/uL (ref 0.1–1.0)
Monocytes Relative: 8 %
NEUTROS ABS: 5.8 10*3/uL (ref 1.7–7.7)
NEUTROS PCT: 61 %
Platelets: 410 10*3/uL — ABNORMAL HIGH (ref 150–400)
RBC: 4.57 MIL/uL (ref 4.22–5.81)
RDW: 16.9 % — AB (ref 11.5–15.5)
WBC: 9.5 10*3/uL (ref 4.0–10.5)

## 2017-11-28 LAB — BASIC METABOLIC PANEL
Anion gap: 12 (ref 5–15)
BUN: 18 mg/dL (ref 8–23)
CALCIUM: 9.2 mg/dL (ref 8.9–10.3)
CO2: 26 mmol/L (ref 22–32)
CREATININE: 1.17 mg/dL (ref 0.61–1.24)
Chloride: 103 mmol/L (ref 98–111)
GFR calc Af Amer: >60 mL/min (ref >60)
GLUCOSE: 111 mg/dL — AB (ref 70–99)
POTASSIUM: 4.6 mmol/L (ref 3.5–5.1)
SODIUM: 141 mmol/L (ref 135–145)

## 2017-11-28 LAB — I-STAT TROPONIN, ED: TROPONIN I, POC: 0 ng/mL (ref 0.00–0.08)

## 2017-11-28 MED ORDER — PREDNISONE 10 MG PO TABS: ORAL_TABLET | ORAL | 0 refills | Status: DC

## 2017-11-28 MED ORDER — MORPHINE SULFATE (PF) 4 MG/ML IV SOLN
4.0000 mg | Freq: Once | INTRAVENOUS | Status: AC
  Administered 2017-11-28: 4 mg via INTRAVENOUS
  Filled 2017-11-28: qty 1

## 2017-11-28 MED ORDER — METHYLPREDNISOLONE SODIUM SUCC 125 MG IJ SOLR
125.0000 mg | Freq: Once | INTRAMUSCULAR | Status: AC
  Administered 2017-11-28: 125 mg via INTRAVENOUS
  Filled 2017-11-28: qty 2

## 2017-11-28 MED ORDER — TRAMADOL HCL 50 MG PO TABS: 50.0000 mg | ORAL_TABLET | Freq: Four times a day (QID) | ORAL | 0 refills | Status: DC | PRN

## 2017-11-28 NOTE — Discharge Instructions (Signed)
Take prednisone as prescribed.   Take motrin for pain. Take tramadol for severe pain   You have severe arthritis in your neck that is causing your symptoms. Still do the MRI as scheduled   See your doctor  Return to ER if you have worse back pain, numbness, weakness, chest pain

## 2017-11-28 NOTE — ED Notes (Signed)
Patient verbalizes understanding of discharge instructions. Opportunity for questioning and answers were provided. Armband removed by staff, pt discharged from ED.  

## 2017-11-28 NOTE — ED Notes (Signed)
Patient transported to CT 

## 2017-11-28 NOTE — ED Triage Notes (Signed)
Pt reports a neck injury >3 months ago and treatment was delayed due to hip replaecment surgery. States he has been back at work now for 2 weeks and that today while he was at work his "arms gave out" and he began having worsening pain between his shoulder blades and neck. Pt states that he has an outpatient MRI scheduled for 10/3 with sedation as he is severely claustrophobic. Also endorses trouble sleeping due to pain.

## 2017-11-28 NOTE — ED Provider Notes (Signed)
[facility] EMERGENCY DEPARTMENT Provider Note   CSN: [id number] Arrival date & time: 11/28/17  1606     History   Chief Complaint Chief Complaint  Patient presents with  . Neck Pain    HPI Keith Calhoun is a 62 y.o. 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 Patient states that she had neck injury more than 3 months ago and is scheduled for MRI in 2 weeks. States that he needs sedation for MRI given previous PTSD while in the [employer]. He was out of work for several months and just got back to work about 2 weeks ago. He noticed that he had pain between his shoulder blades for the last 3 days. He came to the ED 3 days ago for pain but left without being seen. He states that his pain and numbness got worse and he had trouble sleeping so came for evaluation. Denies any bowel or bladder incontinence. Denies any chest pain or shortness of breath.   The history is provided by the patient.    Past Medical History:  Diagnosis Date  . Anemia    2012- turned down for donating blood, due to low count   . Anxiety    claustrophobia   . Arthritis    L knee , L shoudler   . Bronchitis   . Chronic pain   . Diabetes mellitus    dx 2007  . GERD (gastroesophageal reflux disease)   . Headache    ?? stress   . Hemorrhoids   . Hyperlipidemia   . Hypertension   . 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 Internal and external prolapsed hemorrhoids 03/15/2011    Past Surgical History:  Procedure Laterality Date  . HEMORRHOID SURGERY  04/2011  . HEMORRHOID SURGERY  05/09/2011   Procedure: HEMORRHOIDECTOMY;  Surgeon: Imogene Burn. Georgette Dover, MD;  Location: [place];  Service: General;  Laterality: N/A;  . [place]   left  . RADIOLOGY WITH ANESTHESIA N/A 04/24/2016   Procedure: MRI - CERVICAL SPINE WITHOUT  CONTRAST AND LUMBAR SPINE WITHOUT CONTRAST;  Surgeon: Medication Radiologist, MD;  Location: [facility];  Service: Radiology;  Laterality: N/A;  . RADIOLOGY WITH ANESTHESIA Right 01/24/2017   Procedure: MRI RIGHT HIP WITHOUT;  Surgeon: Radiologist, Medication, MD;  Location: [place];  Service: Radiology;  Laterality: Right;  . SHOULDER ARTHROSCOPY DISTAL CLAVICLE EXCISION AND OPEN ROTATOR CUFF REPAIR     L shoulder   . TONSILLECTOMY     as a child        Home Medications    Prior to Admission medications   Medication Sig Start Date End Date Taking? Authorizing Provider  acetaminophen (TYLENOL) 500 MG tablet Take 1,000 mg by mouth every 6 (six) hours as needed for mild pain or headache.  06/25/17  Yes [provider]  albuterol (PROVENTIL HFA;VENTOLIN HFA) 108 (90 Base) MCG/ACT inhaler Inhale 2 puffs every 6 (six) hours as needed into the lungs for wheezing or shortness of breath.    Yes [provider]  aspirin EC 325 MG tablet Take 325 mg by mouth at bedtime.  06/25/17  Yes [provider]  clindamycin (CLEOCIN T) 1 % external solution Apply 1 application daily as needed topically (for after shaving).   Yes [provider]  Coenzyme Q10 100 MG TABS Take 1 tablet (100 mg total) by mouth 3 (three) times daily. Patient taking differently: Take 1 tablet by mouth every 7 (seven) days.  09/23/17  Yes Jaffe, Adam R, DO  latanoprost (XALATAN) 0.005 % ophthalmic solution Place 1 drop into both eyes at bedtime.   Yes [provider]  lisinopril (PRINIVIL,ZESTRIL) 40 MG tablet Take 40 mg by mouth at bedtime.   Yes [provider]  Magnesium 400 MG TABS Take 1 tablet by mouth daily. Patient taking differently: Take 1 tablet by mouth daily as needed (for supplementation).  09/23/17  Yes Tomi Likens, Adam R, DO  metFORMIN (GLUCOPHAGE XR) 750 MG 24 hr tablet Take 750 mg by mouth 2 (two) times daily.   Yes [provider]  nortriptyline (PAMELOR) 10 MG  capsule Take 1 capsule (10 mg total) by mouth at bedtime. 10/22/17  Yes Tomi Likens, Adam R, DO  oxymetazoline (AFRIN) 0.05 % nasal spray Place 1 spray 2 (two) times daily as needed into both nostrils for congestion.   Yes [provider]  pravastatin (PRAVACHOL) 10 MG tablet Take 10 mg by mouth at bedtime.   Yes [provider]  Riboflavin 400 MG TABS Take 1 tablet by mouth daily. 09/23/17  Yes Jaffe, Adam R, DO  senna-docusate (SENOKOT-S) 8.6-50 MG tablet Take 2 tablets by mouth 2 (two) times daily. 06/25/17  Yes [provider]  zolmitriptan (ZOMIG) 5 MG tablet Take 5 mg by mouth as needed for migraine.    Yes [provider]  benzonatate (TESSALON) 100 MG capsule Take 100 mg by mouth 2 (two) times daily.    [provider]  predniSONE (DELTASONE) 10 MG tablet Take 60 mg daily x 2 days then 40 mg daily x 2 days then 20 mg daily x 2 days 11/28/17   Drenda Freeze, MD  topiramate (TOPAMAX) 50 MG tablet Take 1 tablet (50 mg total) by mouth at bedtime. Patient not taking: Reported on 11/28/2017 09/10/17   Pieter Partridge, DO  traMADol (ULTRAM) 50 MG tablet Take 1 tablet (50 mg total) by mouth every 6 (six) hours as needed. 11/28/17   Drenda Freeze, MD    Family History Family History  Problem Relation Age of Onset  . Heart disease Father   . Blindness Mother   . Anesthesia problems Neg Hx   . Hypotension Neg Hx   . Malignant hyperthermia Neg Hx   . Pseudochol deficiency Neg Hx     Social History Social History   Tobacco Use  . Smoking status: Former Smoker    Types: Cigarettes    Last attempt to quit: 03/13/2007    Years since quitting: 10.7  . Smokeless tobacco: Never Used  Substance Use Topics  . Alcohol use: Yes    Comment: rare  . Drug use: No     Allergies   Patient has no known allergies.   Review of Systems Review of Systems  Musculoskeletal: Positive for neck pain.  Neurological: Positive for numbness.  All other systems  reviewed and are negative.    Physical Exam Updated Vital Signs BP (!) 158/91 (BP Location: Right Arm)   Pulse 60   Temp 99 F (37.2 C) (Oral)   Resp 18   SpO2 98%   Physical Exam  Constitutional: He is oriented to person, place, and time.  Slightly uncomfortable   HENT:  Head: Normocephalic.  Mouth/Throat: Oropharynx is clear and moist.  Eyes: Pupils are equal,  round, and reactive to light. Conjunctivae and EOM are normal.  Neck: Normal range of motion.  Mild bilateral paracervical tenderness   Cardiovascular: Normal rate, regular rhythm and normal heart sounds.  Pulmonary/Chest: Effort normal and breath sounds normal. No stridor. No respiratory distress.  Abdominal: Soft. Bowel sounds are normal. He exhibits no distension. There is no tenderness.  Musculoskeletal: Normal range of motion.  Neurological: He is alert and oriented to person, place, and time.  CN 2- 12 intact. Slightly dec sensation bilateral thumb and index finger but nl sensation on the forearm otherwise. Nl reflexes, nl hand grasp, nl elbow flexion and extension. Nl neuro exam bilateral lower extremities   Skin: Skin is warm.  Psychiatric: He has a normal mood and affect.  Nursing note and vitals reviewed.    ED Treatments / Results  Labs (all labs ordered are listed, but only abnormal results are displayed) Labs Reviewed  CBC WITH DIFFERENTIAL/PLATELET - Abnormal; Notable for the following components:      Result Value   Hemoglobin 11.9 (*)    HCT 37.7 (*)    RDW 16.9 (*)    Platelets 410 (*)    All other components within normal limits  BASIC METABOLIC PANEL - Abnormal; Notable for the following components:   Glucose, Bld 111 (*)    All other components within normal limits  I-STAT TROPONIN, ED    EKG None  Radiology Dg Chest 2 View  Result Date: 11/28/2017 CLINICAL DATA:  Back pain and chest pain EXAM: CHEST - 2 VIEW COMPARISON:  09/12/2014 FINDINGS: The heart size and mediastinal contours  are within normal limits. Both lungs are clear. Degenerative osteophytes of the spine. IMPRESSION: No active cardiopulmonary disease. Electronically Signed   By: Donavan Foil M.D.   On: 11/28/2017 20:16   Ct Cervical Spine Wo Contrast  Result Date: 11/28/2017 CLINICAL DATA:  Neck injury greater than 3 months ago. Status post recent hip replacement. Arms gave out at work today with intrascapular and neck pain. EXAM: CT CERVICAL SPINE WITHOUT CONTRAST TECHNIQUE: Multidetector CT imaging of the cervical spine was performed without intravenous contrast. Multiplanar CT image reconstructions were also generated. COMPARISON:  MRI cervical spine April 24, 2016. FINDINGS: ALIGNMENT: Straightened lordosis.  Vertebral bodies in alignment. SKULL BASE AND VERTEBRAE: Cervical vertebral bodies and posterior elements are intact. Progressed moderate to severe C4-5 through C6-7 disc height loss with endplate sclerosis and ventral spurring compatible with degenerative discs. C1-2 articulation maintained with moderate arthropathy. Severe RIGHT C2-3 facet arthropathy. No destructive bony lesions. No destructive bony lesions. C1-2 articulation maintained. SOFT TISSUES AND SPINAL CANAL: Nonacute. Subcentimeter LEFT thyroid calcification. DISC LEVELS: No significant osseous canal stenosis. Severe RIGHT C2-3, LEFT C4-5 neural foraminal narrowing. Moderate C5-6 and C6-7 neural foraminal narrowing. Severe T2-3 neural foraminal narrowing. UPPER CHEST: Lung apices are clear. OTHER: None. IMPRESSION: 1. No fracture or malalignment. 2. Degenerative change of the cervical spine resulting in multilevel severe neural foraminal narrowing. Electronically Signed   By: Elon Alas M.D.   On: 11/28/2017 20:06    Procedures Procedures (including critical care time)  Medications Ordered in ED Medications  morphine 4 MG/ML injection 4 mg (4 mg Intravenous Given 11/28/17 2041)  methylPREDNISolone sodium succinate (SOLU-MEDROL) 125 mg/2  mL injection 125 mg (125 mg Intravenous Given 11/28/17 2041)     Initial Impression / Assessment and Plan / ED Course  I have reviewed the triage vital signs and the nursing notes.  Pertinent labs & imaging results that were  available during my care of the patient were reviewed by me and considered in my medical decision making (see chart for details).     Keith Calhoun is a 62 y.o. male here with neck pain, back pain. I think likely cervical radiculopathy. Patient has normal motor strength and had previous MRI that showed multi level degenerative changes (2/18). He required sedation for MRI. I don't think he needs emergent MRI right now. Will give pain meds, steroids, CT cervical spine.  10 pm Labs unremarkable. CT cervical spine showed degenerative disc disease. Pain improved with steroids, pain meds. Will dc home with prednisone taper, tramadol prn. He can get his outpatient MRI as scheduled. Gave strict return precautions.      Final Clinical Impressions(s) / ED Diagnoses   Final diagnoses:  Cervical radiculopathy    ED Discharge Orders         Ordered    predniSONE (DELTASONE) 10 MG tablet     11/28/17 2221    traMADol (ULTRAM) 50 MG tablet  Every 6 hours PRN     11/28/17 2221           Drenda Freeze, MD 11/29/17 [phone]

## 2017-11-28 NOTE — ED Provider Notes (Signed)
Patient placed in Quick Look pathway, seen and evaluated   Chief Complaint: neck pain  HPI:   Keith Calhoun is a 62 y.o. male who present to the ED with neck pain.   ROS: M/S: neck pain  Physical Exam:  BP 126/88 (BP Location: Left Arm)   Pulse 85   Temp 99 F (37.2 C) (Oral)   Resp 18   SpO2 98%    Gen: No distress  Neuro: Awake and Alert, grips are equal  Skin: Warm and dry  Neck: tender with palpation base of c spine     Initiation of care has begun. The patient has been counseled on the process, plan, and necessity for staying for the completion/evaluation, and the remainder of the medical screening examination    Ashley Murrain, NP 11/28/17 [place], [place], MD 11/29/17 [phone]

## 2017-12-09 ENCOUNTER — Other Ambulatory Visit: Payer: Self-pay | Admitting: *Deleted

## 2017-12-09 ENCOUNTER — Telehealth: Payer: Self-pay | Admitting: Neurology

## 2017-12-09 DIAGNOSIS — G43709 Chronic migraine without aura, not intractable, without status migrainosus: Secondary | ICD-10-CM

## 2017-12-09 MED ORDER — NORTRIPTYLINE HCL 10 MG PO CAPS: 20.0000 mg | ORAL_CAPSULE | Freq: Every day | ORAL | 3 refills | Status: DC

## 2017-12-09 NOTE — Telephone Encounter (Signed)
I called patient back and he needs refill on nortriptyline since it has been increased to 20 mg qhs.

## 2017-12-09 NOTE — Telephone Encounter (Signed)
Patient called and left a voicemail and wanted to speak with the nurse or Dr.Jaffe. Please call him back at [phone].  Thanks!

## 2017-12-11 ENCOUNTER — Telehealth: Payer: Self-pay | Admitting: Neurology

## 2017-12-11 NOTE — Telephone Encounter (Signed)
Voicemail left stating he is needing to increase dose of the nortriptlyine. He is running out. The fax number is [phone] for the pharmacy. Thanks!

## 2017-12-12 ENCOUNTER — Ambulatory Visit (HOSPITAL_COMMUNITY): Payer: Non-veteran care

## 2017-12-12 ENCOUNTER — Ambulatory Visit: Admit: 2017-12-12 | Payer: Non-veteran care

## 2017-12-12 ENCOUNTER — Encounter (HOSPITAL_COMMUNITY): Payer: Self-pay

## 2017-12-12 SURGERY — MRI WITH ANESTHESIA
Anesthesia: General

## 2017-12-12 MED ORDER — NORTRIPTYLINE HCL 10 MG PO CAPS: 20.0000 mg | ORAL_CAPSULE | Freq: Every day | ORAL | 5 refills | Status: DC

## 2017-12-12 NOTE — Telephone Encounter (Signed)
Called and spoke with Pt. Rx was not rcvd by [facility] on 12/09/17, will refax

## 2017-12-13 ENCOUNTER — Telehealth: Payer: Self-pay | Admitting: Neurology

## 2017-12-13 NOTE — Telephone Encounter (Signed)
Called [facility] advising Pt Rx was faxed to [facility] this morning

## 2017-12-13 NOTE — Telephone Encounter (Signed)
Patient needs to know if we called in the Nortriptyline to the [place] please call patient

## 2017-12-18 ENCOUNTER — Other Ambulatory Visit (HOSPITAL_COMMUNITY): Payer: Self-pay | Admitting: Family Medicine

## 2017-12-18 DIAGNOSIS — M542 Cervicalgia: Secondary | ICD-10-CM

## 2017-12-30 ENCOUNTER — Ambulatory Visit: Payer: Non-veteran care | Admitting: Neurology

## 2017-12-31 ENCOUNTER — Ambulatory Visit: Payer: Non-veteran care | Admitting: Neurology

## 2018-01-10 ENCOUNTER — Encounter (HOSPITAL_COMMUNITY): Payer: Self-pay | Admitting: *Deleted

## 2018-01-10 ENCOUNTER — Other Ambulatory Visit: Payer: Self-pay

## 2018-01-10 NOTE — Progress Notes (Signed)
   01/10/18 1205  OBSTRUCTIVE SLEEP APNEA  Have you ever been diagnosed with sleep apnea through a sleep study? No  Do you snore loudly (loud enough to be heard through closed doors)?  0  Do you often feel tired, fatigued, or sleepy during the daytime (such as falling asleep during driving or talking to someone)? 0  Has anyone observed you stop breathing during your sleep? 0  Do you have, or are you being treated for high blood pressure? 1  BMI more than 35 kg/m2? 1  Age > 50 (1-yes) 1  Neck circumference greater than:Male 16 inches or larger, Male 17inches or larger? 1  Male Gender (Yes=1) 1  Obstructive Sleep Apnea Score 5

## 2018-01-10 NOTE — Progress Notes (Signed)
Pt denies SOB, chest pain, and being under the care of a cardiologist. Pt denies having a stress test, echo and cardiac cath. Pt denies recent labs. Pt made aware to stop taking vitamins and herbal medications. Pt made aware to not take Metformin on DOS. Pt made aware to check BG every 2 hours prior to arrival to hospital on DOS. Pt made aware to treat a BG < 70 with 4 glucose tabs wait 15 minutes after intervention to recheck BG, if BG remains < 70, call Short Stay unit to speak with a nurse. Nurse made several unsuccessful attempts to contact MD regarding H&P; pt stated that he will reach out to Dr. Landry Mellow for the H&P. Pt stated that he will follow up with nurse. Pt verbalized understanding of all pre-op instructions.

## 2018-01-13 NOTE — Anesthesia Preprocedure Evaluation (Addendum)
Anesthesia Evaluation  Patient identified by MRN, date of birth, ID band Patient awake    Reviewed: Allergy & Precautions, NPO status , Patient's Chart, lab work & pertinent test results  Airway Mallampati: III  TM Distance: >3 FB Neck ROM: Full    Dental  (+) Missing   Pulmonary former smoker,    Pulmonary exam normal breath sounds clear to auscultation       Cardiovascular hypertension, Pt. on medications Normal cardiovascular exam Rhythm:Regular Rate:Normal  ECG: NSR, rate 72   Neuro/Psych  Headaches, Anxiety    GI/Hepatic negative GI ROS, Neg liver ROS,   Endo/Other  diabetes, Oral Hypoglycemic Agents  Renal/GU negative Renal ROS     Musculoskeletal Chronic pain   Abdominal   Peds  Hematology HLD   Anesthesia Other Findings Neck pain  Reproductive/Obstetrics                           Anesthesia Physical Anesthesia Plan  ASA: III  Anesthesia Plan: General   Post-op Pain Management:    Induction: Intravenous  PONV Risk Score and Plan: 2 and Ondansetron, Dexamethasone, Midazolam and Treatment may vary due to age or medical condition  Airway Management Planned: LMA  Additional Equipment:   Intra-op Plan:   Post-operative Plan: Extubation in OR  Informed Consent: I have reviewed the patients History and Physical, chart, labs and discussed the procedure including the risks, benefits and alternatives for the proposed anesthesia with the patient or authorized representative who has indicated his/her understanding and acceptance.   Dental advisory given  Plan Discussed with: CRNA  Anesthesia Plan Comments:        Anesthesia Quick Evaluation

## 2018-01-14 ENCOUNTER — Ambulatory Visit (HOSPITAL_COMMUNITY): Payer: No Typology Code available for payment source | Admitting: Anesthesiology

## 2018-01-14 ENCOUNTER — Ambulatory Visit (HOSPITAL_COMMUNITY)
Admission: RE | Admit: 2018-01-14 | Discharge: 2018-01-14 | Disposition: A | Discharge status: Home / Self Care | Payer: No Typology Code available for payment source | Source: Ambulatory Visit | Attending: Otolaryngology | Admitting: Otolaryngology

## 2018-01-14 ENCOUNTER — Other Ambulatory Visit: Payer: Self-pay

## 2018-01-14 ENCOUNTER — Encounter (HOSPITAL_COMMUNITY): Payer: Self-pay | Admitting: Certified Registered Nurse Anesthetist

## 2018-01-14 ENCOUNTER — Encounter (HOSPITAL_COMMUNITY)
Admission: RE | Disposition: A | Discharge status: Home / Self Care | Payer: Self-pay | Source: Ambulatory Visit | Attending: Otolaryngology

## 2018-01-14 ENCOUNTER — Ambulatory Visit (HOSPITAL_COMMUNITY)
Admission: RE | Admit: 2018-01-14 | Discharge: 2018-01-14 | Disposition: A | Discharge status: Home / Self Care | Payer: No Typology Code available for payment source | Source: Ambulatory Visit | Attending: Family Medicine | Admitting: Family Medicine

## 2018-01-14 DIAGNOSIS — E785 Hyperlipidemia, unspecified: Secondary | ICD-10-CM | POA: Insufficient documentation

## 2018-01-14 DIAGNOSIS — I10 Essential (primary) hypertension: Secondary | ICD-10-CM | POA: Diagnosis not present

## 2018-01-14 DIAGNOSIS — M47894 Other spondylosis, thoracic region: Secondary | ICD-10-CM | POA: Insufficient documentation

## 2018-01-14 DIAGNOSIS — M542 Cervicalgia: Secondary | ICD-10-CM

## 2018-01-14 DIAGNOSIS — E119 Type 2 diabetes mellitus without complications: Secondary | ICD-10-CM | POA: Insufficient documentation

## 2018-01-14 DIAGNOSIS — R51 Headache: Secondary | ICD-10-CM | POA: Insufficient documentation

## 2018-01-14 DIAGNOSIS — M47892 Other spondylosis, cervical region: Secondary | ICD-10-CM | POA: Insufficient documentation

## 2018-01-14 DIAGNOSIS — Z79899 Other long term (current) drug therapy: Secondary | ICD-10-CM | POA: Insufficient documentation

## 2018-01-14 DIAGNOSIS — Z7984 Long term (current) use of oral hypoglycemic drugs: Secondary | ICD-10-CM | POA: Insufficient documentation

## 2018-01-14 DIAGNOSIS — G8929 Other chronic pain: Secondary | ICD-10-CM | POA: Insufficient documentation

## 2018-01-14 DIAGNOSIS — Z7982 Long term (current) use of aspirin: Secondary | ICD-10-CM | POA: Insufficient documentation

## 2018-01-14 DIAGNOSIS — M4802 Spinal stenosis, cervical region: Secondary | ICD-10-CM | POA: Diagnosis not present

## 2018-01-14 DIAGNOSIS — M4804 Spinal stenosis, thoracic region: Secondary | ICD-10-CM | POA: Insufficient documentation

## 2018-01-14 DIAGNOSIS — Z87891 Personal history of nicotine dependence: Secondary | ICD-10-CM | POA: Diagnosis not present

## 2018-01-14 HISTORY — PX: RADIOLOGY WITH ANESTHESIA: SHX6223

## 2018-01-14 HISTORY — DX: Cervicalgia: M54.2

## 2018-01-14 HISTORY — DX: Presence of dental prosthetic device (complete) (partial): Z97.2

## 2018-01-14 HISTORY — DX: Presence of spectacles and contact lenses: Z97.3

## 2018-01-14 LAB — BASIC METABOLIC PANEL
Anion gap: 8 (ref 5–15)
BUN: 19 mg/dL (ref 8–23)
CHLORIDE: 101 mmol/L (ref 98–111)
CO2: 26 mmol/L (ref 22–32)
Calcium: 8.5 mg/dL — ABNORMAL LOW (ref 8.9–10.3)
Creatinine, Ser: 1.16 mg/dL (ref 0.61–1.24)
Glucose, Bld: 203 mg/dL — ABNORMAL HIGH (ref 70–99)
POTASSIUM: 4.6 mmol/L (ref 3.5–5.1)
SODIUM: 135 mmol/L (ref 135–145)

## 2018-01-14 LAB — GLUCOSE, CAPILLARY: Glucose-Capillary: 151 mg/dL — ABNORMAL HIGH (ref 70–99)

## 2018-01-14 SURGERY — MRI WITH ANESTHESIA
Anesthesia: General

## 2018-01-14 MED ORDER — FENTANYL CITRATE (PF) 100 MCG/2ML IJ SOLN
INTRAMUSCULAR | Status: DC | PRN
  Administered 2018-01-14: 50 ug via INTRAVENOUS

## 2018-01-14 MED ORDER — LACTATED RINGERS IV SOLN
INTRAVENOUS | Status: DC
  Administered 2018-01-14: 07:00:00 via INTRAVENOUS

## 2018-01-14 MED ORDER — LIDOCAINE HCL (CARDIAC) PF 100 MG/5ML IV SOSY
PREFILLED_SYRINGE | INTRAVENOUS | Status: DC | PRN
  Administered 2018-01-14: 80 mg via INTRAVENOUS

## 2018-01-14 MED ORDER — ONDANSETRON HCL 4 MG/2ML IJ SOLN
INTRAMUSCULAR | Status: DC | PRN
  Administered 2018-01-14: 4 mg via INTRAVENOUS

## 2018-01-14 MED ORDER — ONDANSETRON HCL 4 MG/2ML IJ SOLN: 4.0000 mg | Freq: Once | INTRAMUSCULAR | Status: DC | PRN

## 2018-01-14 MED ORDER — PROPOFOL 10 MG/ML IV BOLUS
INTRAVENOUS | Status: DC | PRN
  Administered 2018-01-14: 200 mg via INTRAVENOUS

## 2018-01-14 MED ORDER — FENTANYL CITRATE (PF) 100 MCG/2ML IJ SOLN: 25.0000 ug | INTRAMUSCULAR | Status: DC | PRN

## 2018-01-14 MED ORDER — MIDAZOLAM HCL 2 MG/2ML IJ SOLN
INTRAMUSCULAR | Status: DC | PRN
  Administered 2018-01-14: 2 mg via INTRAVENOUS

## 2018-01-14 NOTE — Anesthesia Postprocedure Evaluation (Signed)
Anesthesia Post Note  Patient: [name]  Procedure(s) Performed: MRI WITH ANESTHESIA OF CERVICAL SPINE WITHOUT CONTRAST (N/A )     Patient location during evaluation: PACU Anesthesia Type: General Level of consciousness: awake and alert Pain management: pain level controlled Vital Signs Assessment: post-procedure vital signs reviewed and stable Respiratory status: spontaneous breathing, nonlabored ventilation, respiratory function stable and patient connected to nasal cannula oxygen Cardiovascular status: blood pressure returned to baseline and stable Postop Assessment: no apparent nausea or vomiting Anesthetic complications: no    Last Vitals:  Vitals:   01/14/18 0945 01/14/18 0955  BP: (!) 141/94 140/85  Pulse: 76 75  Resp: 20 18  Temp:  (!) 36.2 C  SpO2: 97% 98%    Last Pain:  Vitals:   01/14/18 0945  TempSrc:   PainSc: 0-No pain                 [name]

## 2018-01-14 NOTE — H&P (Signed)
Anesthesia H&P Update: History and Physical Exam reviewed; patient is OK for planned anesthetic and procedure. ? ?

## 2018-01-14 NOTE — Transfer of Care (Addendum)
Immediate Anesthesia Transfer of Care Note  Patient: Keith Calhoun  Procedure(s) Performed: MRI WITH ANESTHESIA OF CERVICAL SPINE WITHOUT CONTRAST (N/A )  Patient Location: PACU  Anesthesia Type:General  Level of Consciousness: awake, alert  and oriented  Airway & Oxygen Therapy: Patient Spontanous Breathing and Patient connected to nasal cannula oxygen  Post-op Assessment: Report given to RN and Post -op Vital signs reviewed and stable  Post vital signs: Reviewed and stable  Last Vitals:  Vitals Value Taken Time  BP 135/95 01/14/2018  9:30 AM  Temp 36.2 C 01/14/2018  9:30 AM  Pulse 57 01/14/2018  9:40 AM  Resp 13 01/14/2018  9:40 AM  SpO2 98 % 01/14/2018  9:40 AM  Vitals shown include unvalidated device data.  Last Pain:  Vitals:   01/14/18 0930  TempSrc:   PainSc: 0-No pain         Complications: No apparent anesthesia complications

## 2018-01-14 NOTE — Anesthesia Procedure Notes (Signed)
Procedure Name: LMA Insertion Date/Time: 01/14/2018 8:31 AM Performed by: Inda Coke, CRNA Pre-anesthesia Checklist: Patient identified, Emergency Drugs available, Suction available and Patient being monitored Patient Re-evaluated:Patient Re-evaluated prior to induction Oxygen Delivery Method: Circle System Utilized Preoxygenation: Pre-oxygenation with 100% oxygen Induction Type: IV induction Ventilation: Mask ventilation without difficulty LMA: LMA inserted LMA Size: 5.0 Number of attempts: 1 Airway Equipment and Method: Bite block Placement Confirmation: positive ETCO2 and breath sounds checked- equal and bilateral Tube secured with: Tape Dental Injury: Teeth and Oropharynx as per pre-operative assessment

## 2018-01-15 ENCOUNTER — Encounter (HOSPITAL_COMMUNITY): Payer: Self-pay | Admitting: Radiology

## 2018-01-16 NOTE — Progress Notes (Signed)
NEUROLOGY FOLLOW UP OFFICE NOTE  NORLAN RANN [id number]  HISTORY OF PRESENT ILLNESS: Dhani Dannemiller is a 62 year old right-handed man with hypertension, type 2 diabetes mellitus, hyperlipidemia, chronic neck and back pain, GERD, and asthma who follows up for chronic migraines.  UPDATE: He did not tolerate topiramate.  He was instead switched to nortriptyline. He has been headache-free for one month.  Responds to Zomig after a 30 minutes He had a repeat MRI of cervical spine with advanced degeneration and spinal stenosis stable. Current NSAIDS: Aspirin 325 mg Current analgesics: Acetaminophen Current triptans: Zomig 5 mg tablet Current ergotamine: None Current anti-emetic: None Current muscle relaxants: None Current anti-anxiolytic: None Current sleep aide: None Current Antihypertensive medications: Lisinopril Current Antidepressant medications: Nortriptyline 10 mg at bedtime Current Anticonvulsant medications: gabapentin 300 mg daily Current anti-CGRP: None Current Vitamins/Herbal/Supplements: Magnesium citrate 400 mg daily, riboflavin 400 mg, co-Q10 100 mg 3 times daily Current Antihistamines/Decongestants: None Other therapy: None  Caffeine: No Diet: Hydrates Exercise:  He is more active at work. Depression: No; Anxiety: Yes, only because of headaches Other pain: History of chronic neck and back pain.  Sleep hygiene: Poor.  Trouble falling asleep and staying asleep, often due to ongoing generalized aches and pain.  HISTORY:  Onset: Since age 1 to 62 years old. Location:  Right temporal Quality:  Throbbing/pressure Initial intensity:  8/10.  He denies thunderclap headache or severe headache.  He may wake up with headache (as they often occur at bedtime) but headaches themselves do not wake him up. Aura:  no Prodrome:  no Postdrome:  no Associated symptoms: Nausea, photophobia.  No vomiting, phonophobia or visual disturbance.  He denies associated unilateral numbness or  weakness. Initial duration:  30 minutes with Zomig 5mg , otherwise several hours Initial Frequency:  Twice daily, always at night. Initial Frequency of abortive medication: 5 to 7 days a week Triggers: Anxiety Relieving factors:  ice Activity:  aggravates  Past NSAIDS:  ibuprofen, Celebrex, naproxen 500mg  Past analgesics:  tramadol, hydrocodone, tramadol Past abortive triptans:  no Past muscle relaxants:  no Past anti-emetic:  no Past antihypertensive medications:  no Past antidepressant medications:  amitriptyline 25mg  (took briefly for a few days to help with sleep) Past anticonvulsant medications:   Topiramate 50 mg (cause nausea) Past vitamins/Herbal/Supplements:  no Past antihistamines/decongestants:  no Other past therapies:  no  Family history of headache:  No  PAST MEDICAL HISTORY: Past Medical History:  Diagnosis Date  . Anemia    2012- turned down for donating blood, due to low count   . Anxiety    claustrophobia   . Arthritis    L knee , L shoudler   . Bronchitis   . Chronic pain   . Diabetes mellitus    dx 2007  . GERD (gastroesophageal reflux disease)   . Headache    ?? stress   . Hemorrhoids   . Hyperlipidemia   . Hypertension   . Neck pain   . 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 Wears dentures   . Wears glasses     MEDICATIONS: Current Outpatient Medications on File Prior to Visit  Medication Sig Dispense Refill  . acetaminophen (TYLENOL) 500 MG tablet Take 1,000 mg by mouth every 6 (six) hours as needed for mild pain or headache.     . albuterol (PROVENTIL HFA;VENTOLIN HFA) 108 (90 Base) MCG/ACT inhaler Inhale 2 puffs every 6 (six) hours as needed into the lungs for wheezing or shortness of  breath.     Marland Kitchen aspirin EC 325 MG tablet Take 325 mg by mouth at bedtime.     . clindamycin (CLEOCIN T) 1 % external solution Apply 1 application daily as needed topically (for after shaving).    . Coenzyme Q10 100 MG TABS Take 1 tablet (100 mg  total) by mouth 3 (three) times daily. (Patient not taking: Reported on 01/08/2018) 270 tablet 3  . docusate sodium (COLACE) 50 MG capsule Take 100 mg by mouth 2 (two) times daily.    Marland Kitchen gabapentin (NEURONTIN) 300 MG capsule Take 300 mg by mouth at bedtime.    Marland Kitchen latanoprost (XALATAN) 0.005 % ophthalmic solution Place 1 drop into both eyes at bedtime.    Marland Kitchen lisinopril (PRINIVIL,ZESTRIL) 40 MG tablet Take 40 mg by mouth at bedtime.    . Magnesium 400 MG TABS Take 1 tablet by mouth daily. (Patient taking differently: Take 400 mg by mouth daily. ) 90 tablet 3  . metFORMIN (GLUCOPHAGE XR) 750 MG 24 hr tablet Take 750 mg by mouth 2 (two) times daily.    . nortriptyline (PAMELOR) 10 MG capsule Take 2 capsules (20 mg total) by mouth at bedtime. 60 capsule 5  . oxymetazoline (AFRIN) 0.05 % nasal spray Place 1 spray 2 (two) times daily as needed into both nostrils for congestion.    . polyethylene glycol powder (GLYCOLAX/MIRALAX) powder Take 17 g by mouth daily as needed (constipation).    . pravastatin (PRAVACHOL) 10 MG tablet Take 10 mg by mouth at bedtime.    . predniSONE (DELTASONE) 10 MG tablet Take 60 mg daily x 2 days then 40 mg daily x 2 days then 20 mg daily x 2 days (Patient not taking: Reported on 01/08/2018) 12 tablet 0  . Riboflavin 400 MG TABS Take 1 tablet by mouth daily. (Patient not taking: Reported on 01/08/2018) 90 tablet 3  . topiramate (TOPAMAX) 50 MG tablet Take 1 tablet (50 mg total) by mouth at bedtime. (Patient not taking: Reported on 11/28/2017) 30 tablet 2  . traMADol (ULTRAM) 50 MG tablet Take 1 tablet (50 mg total) by mouth every 6 (six) hours as needed. (Patient not taking: Reported on 01/08/2018) 10 tablet 0   No current facility-administered medications on file prior to visit.     ALLERGIES: No Known Allergies  FAMILY HISTORY: Family History  Problem Relation Age of Onset  . Heart disease Father   . Blindness Mother   . Anesthesia problems Neg Hx   . Hypotension Neg Hx    . Malignant hyperthermia Neg Hx   . Pseudochol deficiency Neg Hx    SOCIAL HISTORY: Social History   Socioeconomic History  . Marital status: Divorced    Spouse name: Not on file  . Number of children: 2  . Years of education: Not on file  . Highest education level: Some college, no degree  Occupational History    Employer: [employer]    Comment: FMLA  leave  Social Needs  . Financial resource strain: Not on file  . Food insecurity:    Worry: Not on file    Inability: Not on file  . Transportation needs:    Medical: Not on file    Non-medical: Not on file  Tobacco Use  . Smoking status: Former Smoker    Types: Cigarettes    Last attempt to quit: 03/13/2007    Years since quitting: 10.8  . Smokeless tobacco: Never Used  Substance and Sexual Activity  . Alcohol use: Not  Currently  . Drug use: No  . Sexual activity: Not on file  Lifestyle  . Physical activity:    Days per week: Not on file    Minutes per session: Not on file  . Stress: Not on file  Relationships  . Social connections:    Talks on phone: Not on file    Gets together: Not on file    Attends religious service: Not on file    Active member of club or organization: Not on file    Attends meetings of clubs or organizations: Not on file    Relationship status: Not on file  . Intimate partner violence:    Fear of current or ex partner: Not on file    Emotionally abused: Not on file    Physically abused: Not on file    Forced sexual activity: Not on file  Other Topics Concern  . Not on file  Social History Narrative   Patient is right-handed. He takes care of his mother, she lives with him in a one story house. He is currently in P/T 3 x week. He drinks tea 3 x week.    REVIEW OF SYSTEMS: Constitutional: No fevers, chills, or sweats, no generalized fatigue, change in appetite Eyes: No visual changes, double vision, eye pain Ear, nose and throat: No hearing loss, ear pain, nasal congestion, sore  throat Cardiovascular: No chest pain, palpitations Respiratory:  No shortness of breath at rest or with exertion, wheezes GastrointestinaI: No nausea, vomiting, diarrhea, abdominal pain, fecal incontinence Genitourinary:  No dysuria, urinary retention or frequency Musculoskeletal:  No neck pain, back pain Integumentary: No rash, pruritus, skin lesions Neurological: as above Psychiatric: No depression, insomnia, anxiety Endocrine: No palpitations, fatigue, diaphoresis, mood swings, change in appetite, change in weight, increased thirst Hematologic/Lymphatic:  No purpura, petechiae. Allergic/Immunologic: no itchy/runny eyes, nasal congestion, recent allergic reactions, rashes  PHYSICAL EXAM: Blood pressure 132/84, pulse 69, height 6\' 2"  (1.88 m), weight 298 lb (135.2 kg), SpO2 97 %. General: No acute distress.  Patient appears well-groomed.   Head:  Normocephalic/atraumatic Eyes:  Fundi examined but not visualized Neck: supple, no paraspinal tenderness, full range of motion Heart:  Regular rate and rhythm Lungs:  Clear to auscultation bilaterally Back: No paraspinal tenderness Neurological Exam: alert and oriented to person, place, and time. Attention span and concentration intact, recent and remote memory intact, fund of knowledge intact.  Speech fluent and not dysarthric, language intact.  CN II-XII intact. Bulk and tone normal, muscle strength 5/5 throughout.  Sensation to light touch, temperature and vibration intact.  Deep tendon reflexes 2+ throughout, toes downgoing.  Finger to nose and heel to shin testing intact.  Gait normal, Romberg negative.  IMPRESSION: Migraine without aura, without status migrainosus, not intractable, improved Degenerative cervical spinal stenosis  PLAN: 1.  Continue nortriptyline 10mg  at bedtime 2.  Zomig 5mg  as needed for abortive care 3.  Limit use of pain relievers to no more than 2 days out of week to prevent risk of rebound or medication-overuse  headache. 4.  Follow up in 6 months  Metta Clines, DO  CC: Dell Ponto, MD

## 2018-01-17 ENCOUNTER — Encounter: Payer: Self-pay | Admitting: Neurology

## 2018-01-17 ENCOUNTER — Ambulatory Visit (INDEPENDENT_AMBULATORY_CARE_PROVIDER_SITE_OTHER): Payer: Non-veteran care | Admitting: Neurology

## 2018-01-17 VITALS — BP 132/84 | HR 69 | Ht 74.0 in | Wt 298.0 lb

## 2018-01-17 DIAGNOSIS — M4802 Spinal stenosis, cervical region: Secondary | ICD-10-CM

## 2018-01-17 DIAGNOSIS — G43009 Migraine without aura, not intractable, without status migrainosus: Secondary | ICD-10-CM | POA: Diagnosis not present

## 2018-01-17 NOTE — Patient Instructions (Signed)
1.  Continue nortriptyline 10mg  at bedtime 2.  May use zolmitriptan as needed for acute migraine, but limit to no more than 2 days out of week to prevent risk of rebound or medication-overuse headache. 3.  Keep headache diary 4.  Continue magnesium, riboflavin and CoQ10 5.  Follow up in 6 months.

## 2018-07-08 ENCOUNTER — Telehealth: Payer: Self-pay | Admitting: Neurology

## 2018-07-08 NOTE — Telephone Encounter (Signed)
Aimovig or Emgalilty depending on coverage (he is [place]).

## 2018-07-08 NOTE — Telephone Encounter (Signed)
Patient said that the Nortriptaline medication is causing him to gain a lot of weight. He was wanting to know if he could be put on the other medication before for migraines. Please advise. Thanks!

## 2018-07-09 NOTE — Telephone Encounter (Signed)
Called and spoke with Pt. He will [facility] to determine which CGRP is covered. He feels like since he retired and does not have the stress in his life, he may not need a preventative. He will stop nortriptyline and if headaches return, he will call. He also has an appt on 5/6, which he will keep. Also sent him a new activation code for MyChart.

## 2018-07-15 NOTE — Progress Notes (Signed)
Virtual Visit via Video Note The purpose of this virtual visit is to provide medical care while limiting exposure to the novel coronavirus.    Consent was obtained for video visit:  Yes.   Answered questions that patient had about telehealth interaction:  Yes.   I discussed the limitations, risks, security and privacy concerns of performing an evaluation and management service by telemedicine. I also discussed with the patient that there may be a patient responsible charge related to this service. The patient expressed understanding and agreed to proceed.  Pt location: Home Physician Location: Home Name of referring provider:  Verline Lema, MD I connected with Sharman Cheek at patients initiation/request on 07/16/2018 at 10:00 AM EDT by video enabled telemedicine application and verified that I am speaking with the correct person using two identifiers. Pt MRN:  [id number] Pt DOB:  07-Jan-1956 Video Participants:  Sharman Cheek   History of Present Illness:  Keith Calhoun is a 63 year old right-handed man with hypertension, type 2 diabetes mellitus, hyperlipidemia, chronic neck and back pain, GERD, and asthma who follows up for migraines.  UPDATE: Headaches have resolved.  He hasn't had a headache since starting the nortriptyline.  Last migraine was in October.  However, he has had about a 20 lb weight gain since starting nortriptyline.  This is a concern.   Current NSAIDS: Aspirin 325 mg Current analgesics: Acetaminophen Current triptans: Zomig 5 mg tablet Current ergotamine: None Current anti-emetic: None Current muscle relaxants: None Current anti-anxiolytic: None Current sleep aide: None Current Antihypertensive medications: Lisinopril Current Antidepressant medications: Nortriptyline 20 mg at bedtime Current Anticonvulsant medications: gabapentin 300 mg daily Current anti-CGRP: None Current Vitamins/Herbal/Supplements: Magnesium citrate 400 mg daily, riboflavin 400 mg, co-Q10 100 mg  3 times daily Current Antihistamines/Decongestants: None Other therapy: None  Caffeine: No Diet: Hydrates Exercise:  He is more active at work. Depression: No; Anxiety: Yes, only because of headaches Other pain: History of chronic neck and back pain.  Sleep hygiene: Poor.  Trouble falling asleep and staying asleep, often due to ongoing generalized aches and pain.  HISTORY: Onset: Age 69 or 63 years old Location:Right temporal Quality:Throbbing/pressure Initial intensity:8/10.Hedenies thunderclap headache or severe headache. He may wake up with headache (as they often occur at bedtime) but headaches themselves do not wake him up. Aura:no Prodrome:no Postdrome:no Associated symptoms: Nausea, photophobia. No vomiting, phonophobia or visual disturbance.Hedenies associated unilateral numbness or weakness. Initial duration:30 minutes with Zomig 5mg , otherwise several hours Initial Frequency:Twice daily, always at night. Initial Frequency of abortive medication:5 to 7 days a week Triggers: Anxiety Relieving factors: Ice Activity:aggravates  Past NSAIDS: ibuprofen, Celebrex, naproxen 500mg  Past analgesics: tramadol, hydrocodone, tramadol Past abortive triptans:no Past muscle relaxants:no Past anti-emetic:no Past antihypertensive medications:no Past antidepressant medications: amitriptyline 25mg  (took briefly for a few days to help with sleep) Past anticonvulsant medications: Topiramate 50 mg (cause nausea) Past vitamins/Herbal/Supplements:no Past antihistamines/decongestants:no Other past therapies:no  Family history of headache:No  He had a repeat MRI of cervical spine on 01/14/18 with advanced degeneration and spinal stenosis stable.  Past Medical History: Past Medical History:  Diagnosis Date  . Anemia    2012- turned down for donating blood, due to low count   . Anxiety    claustrophobia   . Arthritis    L knee , L  shoudler   . Bronchitis   . Chronic pain   . Diabetes mellitus    dx 2007  . GERD (gastroesophageal reflux disease)   . Headache    ??  stress   . Hemorrhoids   . Hyperlipidemia   . Hypertension   . Neck pain   . 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 Wears dentures   . Wears glasses     Medications: Outpatient Encounter Medications as of 07/16/2018  Medication Sig  . acetaminophen (TYLENOL) 500 MG tablet Take 1,000 mg by mouth every 6 (six) hours as needed for mild pain or headache.   . albuterol (PROVENTIL HFA;VENTOLIN HFA) 108 (90 Base) MCG/ACT inhaler Inhale 2 puffs every 6 (six) hours as needed into the lungs for wheezing or shortness of breath.   Marland Kitchen aspirin EC 325 MG tablet Take 325 mg by mouth at bedtime.   . clindamycin (CLEOCIN T) 1 % external solution Apply 1 application daily as needed topically (for after shaving).  . Coenzyme Q10 100 MG TABS Take 1 tablet (100 mg total) by mouth 3 (three) times daily.  Marland Kitchen docusate sodium (COLACE) 50 MG capsule Take 100 mg by mouth 2 (two) times daily.  Marland Kitchen gabapentin (NEURONTIN) 300 MG capsule Take 300 mg by mouth at bedtime.  Marland Kitchen latanoprost (XALATAN) 0.005 % ophthalmic solution Place 1 drop into both eyes at bedtime.  Marland Kitchen lisinopril (PRINIVIL,ZESTRIL) 40 MG tablet Take 40 mg by mouth at bedtime.  . Magnesium 400 MG TABS Take 1 tablet by mouth daily. (Patient taking differently: Take 400 mg by mouth daily. )  . metFORMIN (GLUCOPHAGE XR) 750 MG 24 hr tablet Take 750 mg by mouth 2 (two) times daily.  . nortriptyline (PAMELOR) 10 MG capsule Take 2 capsules (20 mg total) by mouth at bedtime.  Marland Kitchen oxymetazoline (AFRIN) 0.05 % nasal spray Place 1 spray 2 (two) times daily as needed into both nostrils for congestion.  . polyethylene glycol powder (GLYCOLAX/MIRALAX) powder Take 17 g by mouth daily as needed (constipation).  . pravastatin (PRAVACHOL) 10 MG tablet Take 10 mg by mouth at bedtime.  . predniSONE (DELTASONE) 10 MG tablet Take 60  mg daily x 2 days then 40 mg daily x 2 days then 20 mg daily x 2 days (Patient not taking: Reported on 01/08/2018)  . Riboflavin 400 MG TABS Take 1 tablet by mouth daily. (Patient not taking: Reported on 01/08/2018)  . topiramate (TOPAMAX) 50 MG tablet Take 1 tablet (50 mg total) by mouth at bedtime. (Patient not taking: Reported on 11/28/2017)  . traMADol (ULTRAM) 50 MG tablet Take 1 tablet (50 mg total) by mouth every 6 (six) hours as needed. (Patient not taking: Reported on 01/08/2018)   No facility-administered encounter medications on file as of 07/16/2018.     Allergies: No Known Allergies  Family History: Family History  Problem Relation Age of Onset  . Heart disease Father   . Blindness Mother   . Anesthesia problems Neg Hx   . Hypotension Neg Hx   . Malignant hyperthermia Neg Hx   . Pseudochol deficiency Neg Hx     Social History: Social History   Socioeconomic History  . Marital status: Divorced    Spouse name: Not on file  . Number of children: 2  . Years of education: Not on file  . Highest education level: Some college, no degree  Occupational History    Employer: [employer]    Comment: FMLA  leave  Social Needs  . Financial resource strain: Not on file  . Food insecurity:    Worry: Not on file    Inability: Not on file  . Transportation needs:  Medical: Not on file    Non-medical: Not on file  Tobacco Use  . Smoking status: Former Smoker    Types: Cigarettes    Last attempt to quit: 03/13/2007    Years since quitting: 11.3  . Smokeless tobacco: Never Used  Substance and Sexual Activity  . Alcohol use: Not Currently  . Drug use: No  . Sexual activity: Not on file  Lifestyle  . Physical activity:    Days per week: Not on file    Minutes per session: Not on file  . Stress: Not on file  Relationships  . Social connections:    Talks on phone: Not on file    Gets together: Not on file    Attends religious service: Not on file    Active member of club or  organization: Not on file    Attends meetings of clubs or organizations: Not on file    Relationship status: Not on file  . Intimate partner violence:    Fear of current or ex partner: Not on file    Emotionally abused: Not on file    Physically abused: Not on file    Forced sexual activity: Not on file  Other Topics Concern  . Not on file  Social History Narrative   Patient is right-handed. He takes care of his mother, she lives with him in a one story house. He is currently in P/T 3 x week. He drinks tea 3 x week.    Observations/Objective:   Temperature 98.7 F (37.1 C), height 6\' 2"  (1.88 m), weight 282 lb (127.9 kg). Alert and oriented. Speech fluent and not dysarthric.  Language intact. Face symmetric.    Assessment and Plan:   Migraine without aura, without status migrainosus, not intractable.  Migraines have resolved since starting nortriptyline.  He has not had a migraine since October.  However, he endorses 20 lb weight gain despite change in diet and exercise.    1.  We will start him on one of the anti-CGRPs.  We will try Aimovig first.  Once he starts, he may discontinue nortriptyline 2.  He has Zomig if needed. 3.  Limit use of pain relievers to no more than 2 days out of week to prevent risk of rebound or medication-overuse headache. 4.  Keep headache diary 5.  Follow up in 6 months.  Follow Up Instructions:    -I discussed the assessment and treatment plan with the patient. The patient was provided an opportunity to ask questions and all were answered. The patient agreed with the plan and demonstrated an understanding of the instructions.   The patient was advised to call back or seek an in-person evaluation if the symptoms worsen or if the condition fails to improve as anticipated.   Dudley Major, DO

## 2018-07-16 ENCOUNTER — Encounter: Payer: Self-pay | Admitting: Neurology

## 2018-07-16 ENCOUNTER — Telehealth (INDEPENDENT_AMBULATORY_CARE_PROVIDER_SITE_OTHER): Payer: No Typology Code available for payment source | Admitting: Neurology

## 2018-07-16 ENCOUNTER — Other Ambulatory Visit: Payer: Self-pay

## 2018-07-16 VITALS — Temp 98.7°F | Ht 74.0 in | Wt 282.0 lb

## 2018-07-16 DIAGNOSIS — G43009 Migraine without aura, not intractable, without status migrainosus: Secondary | ICD-10-CM

## 2018-07-16 MED ORDER — ERENUMAB-AOOE 70 MG/ML ~~LOC~~ SOAJ: 70.0000 mg | SUBCUTANEOUS | 5 refills | Status: DC

## 2018-07-18 ENCOUNTER — Other Ambulatory Visit: Payer: Self-pay

## 2018-07-18 ENCOUNTER — Ambulatory Visit: Payer: Non-veteran care | Admitting: Neurology

## 2018-07-18 MED ORDER — ERENUMAB-AOOE 70 MG/ML ~~LOC~~ SOAJ: 70.0000 mg | SUBCUTANEOUS | 5 refills | Status: DC

## 2018-07-28 ENCOUNTER — Telehealth: Payer: Self-pay | Admitting: Neurology

## 2018-07-28 NOTE — Telephone Encounter (Signed)
Patient left this message with after hour answering service on 07-27-18 @ 5:45    Caller states DR Tomi Likens discontinues Nortriptyline for Headache. The MD ordered a new medication costing 700.00. his migraine are trying to return. 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 The [facility] pays for all of his medication and medical needs.  The RX should have been sent to the [place] [facility]   Martina Sinner, RN (with answering service) called patient back and informed him to call the office tomorrow to discuss the matter with Jaffe. She state the [place] does not accept RX from private physiciansb

## 2018-07-28 NOTE — Telephone Encounter (Signed)
Patient called back regarding needing his Rx sent to the [place]. He said it will cost him $700 through [facility]. Thanks

## 2018-08-07 ENCOUNTER — Other Ambulatory Visit: Payer: Self-pay

## 2018-08-07 DIAGNOSIS — G43709 Chronic migraine without aura, not intractable, without status migrainosus: Secondary | ICD-10-CM

## 2018-08-07 MED ORDER — ERENUMAB-AOOE 70 MG/ML ~~LOC~~ SOAJ: 70.0000 mg | SUBCUTANEOUS | 5 refills | Status: DC

## 2018-08-07 MED ORDER — RIBOFLAVIN 400 MG PO TABS: 1.0000 | ORAL_TABLET | Freq: Every day | ORAL | 3 refills | Status: DC

## 2018-08-15 ENCOUNTER — Telehealth: Payer: Self-pay | Admitting: Neurology

## 2018-08-15 NOTE — Telephone Encounter (Signed)
Keith Calhoun at the [place] called stating that authorization for pt's headache cocktail has expired. She said that if pt needs to continue on this medication they need a request for additional services along with last ov notes faxed to [phone].

## 2018-08-18 NOTE — Telephone Encounter (Signed)
Called and LMOVM asking Sharyn Lull to return my call to clarify.

## 2018-08-18 NOTE — Telephone Encounter (Signed)
Rcvd call from [place] in the [facility]. She stated we need to fill out a form to request additional services, as authorization was only good for one year and will expire on 08/29/18. I gave her our clinical fax # to send the form. When questioned, this had nothing to do with headache cocktails and she was unsure where that information came from.

## 2018-09-01 NOTE — Progress Notes (Signed)
Virtual Visit via Telephone Note The purpose of this virtual visit is to provide medical care while limiting exposure to the novel coronavirus.    Consent was obtained for phone visit:  Yes.   Answered questions that patient had about telehealth interaction:  Yes.   I discussed the limitations, risks, security and privacy concerns of performing an evaluation and management service by telephone. I also discussed with the patient that there may be a patient responsible charge related to this service. The patient expressed understanding and agreed to proceed.  Pt location: Home Physician Location: office Name of referring provider:  Verline Lema, MD I connected with .Keith Calhoun at patients initiation/request on 09/02/2018 at 10:50 AM EDT by telephone and verified that I am speaking with the correct person using two identifiers.  Pt MRN:  [id number] Pt DOB:  1955/06/18   History of Present Illness:  Keith Calhoun is a 63 year old right-handed man with hypertension, type 2 diabetes mellitus, hyperlipidemia, chronic neck and back pain, GERD, and asthma who follows up for migraines.  UPDATE: Last month, he wished to discontinue nortriptyline due to weight gain.  He was supposed to start [place] but never received it.  He remains on nortriptyline.  No headaches. Current NSAIDS:Aspirin 325 mg Current analgesics:Acetaminophen Current triptans:Zomig 5 mg tablet Current ergotamine:None Current anti-emetic:None Current muscle relaxants:None Current anti-anxiolytic:None Current sleep aide:None Current Antihypertensive medications:Lisinopril Current Antidepressant medications:Nortriptyline 20 mg at bedtime Current Anticonvulsant medications:gabapentin 300 mg daily Current anti-CGRP:none Current Vitamins/Herbal/Supplements:Magnesium citrate 400 mg daily, riboflavin 400 mg, co-Q10 100 mg 3 times daily Current Antihistamines/Decongestants:None Other therapy:None  Caffeine:No  Diet:Hydrates Exercise: He is more active at work. Depression:No; Anxiety:Yes, only because of headaches Other pain:History of chronic neck and back pain.  Sleep hygiene:Poor. Trouble falling asleep and staying asleep, often due to ongoing generalized aches and pain.  HISTORY: Onset: Age 71 or 63 years old Location:Right temporal Quality:Throbbing/pressure Initial intensity:8/10.Hedenies thunderclap headache or severe headache. He may wake up with headache (as they often occur at bedtime) but headaches themselves do not wake him up. Aura:no Prodrome:no Postdrome:no Associated symptoms: Nausea, photophobia. No vomiting, phonophobia or visual disturbance.Hedenies associated unilateral numbness or weakness. Initial duration:30 minutes with Zomig 5mg , otherwise several hours InitialFrequency:Twice daily, always at night. InitialFrequency of abortive medication:5 to 7 days a week Triggers: Anxiety Relieving factors: Ice Activity:aggravates  Past NSAIDS: ibuprofen, Celebrex, naproxen 500mg  Past analgesics: tramadol, hydrocodone, tramadol Past abortive triptans:no Past muscle relaxants:no Past anti-emetic:no Past antihypertensive medications:no Past antidepressant medications: amitriptyline 25mg  (took briefly for a few days to help with sleep) Past anticonvulsant medications:Topiramate 50 mg (cause nausea) Past vitamins/Herbal/Supplements:no Past antihistamines/decongestants:no Other past therapies:no  Family history of headache:No    Observations/Objective:   Height 6\' 2"  (1.88 m), weight 282 lb (127.9 kg). No acute distress. Alert and oriented. Speech and language intact.   Assessment and Plan:   Migraine without aura, without status migrainosus, not intractable, controlled.  He would like to remain on nortriptyline for now.  1.  Nortriptyline for preventative. 2.  Zomig if needed 3.  Limit use of pain relievers  to no more than 2 days out of week to prevent risk of rebound or medication-overuse headache. 4.  Headache diary 5.  Follow up in 6 months.   Follow Up Instructions:    -I discussed the assessment and treatment plan with the patient. The patient was provided an opportunity to ask questions and all were answered. The patient agreed with the plan and demonstrated an understanding  of the instructions.   The patient was advised to call back or seek an in-person evaluation if the symptoms worsen or if the condition fails to improve as anticipated.    Total Time spent in visit with the patient was:  11 minutes  Dudley Major, DO

## 2018-09-02 ENCOUNTER — Telehealth (INDEPENDENT_AMBULATORY_CARE_PROVIDER_SITE_OTHER): Payer: No Typology Code available for payment source | Admitting: Neurology

## 2018-09-02 ENCOUNTER — Encounter: Payer: Self-pay | Admitting: Neurology

## 2018-09-02 ENCOUNTER — Other Ambulatory Visit: Payer: Self-pay

## 2018-09-02 VITALS — Ht 74.0 in | Wt 282.0 lb

## 2018-09-02 DIAGNOSIS — G43009 Migraine without aura, not intractable, without status migrainosus: Secondary | ICD-10-CM | POA: Diagnosis not present

## 2018-09-02 DIAGNOSIS — Z7689 Persons encountering health services in other specified circumstances: Secondary | ICD-10-CM | POA: Insufficient documentation

## 2019-01-06 ENCOUNTER — Telehealth: Payer: Self-pay | Admitting: Neurology

## 2019-01-06 MED ORDER — NORTRIPTYLINE HCL 10 MG PO CAPS: 20.0000 mg | ORAL_CAPSULE | Freq: Every day | ORAL | 5 refills | Status: DC

## 2019-01-06 NOTE — Telephone Encounter (Signed)
Requested Prescriptions   Pending Prescriptions Disp Refills  . nortriptyline (PAMELOR) 10 MG capsule 60 capsule 5    Sig: Take 2 capsules (20 mg total) by mouth at bedtime.   Rx last filled: 12/12/17 #60 5 refills  Pt last seen: 09/02/18  Follow up appt scheduled: 03/05/19

## 2019-01-06 NOTE — Telephone Encounter (Signed)
rx sent

## 2019-01-06 NOTE — Telephone Encounter (Signed)
[facility] but he was to to call out office for [place] to refill the medciation   He is down to 2 days worth of medication. He would like this called into the [facility]   Patient last seen 09-02-18

## 2019-01-19 ENCOUNTER — Ambulatory Visit: Payer: No Typology Code available for payment source | Admitting: Neurology

## 2019-02-12 ENCOUNTER — Other Ambulatory Visit: Payer: Self-pay | Admitting: Neurology

## 2019-02-12 ENCOUNTER — Telehealth: Payer: Self-pay | Admitting: Neurology

## 2019-02-12 MED ORDER — NORTRIPTYLINE HCL 10 MG PO CAPS: 20.0000 mg | ORAL_CAPSULE | Freq: Every day | ORAL | 5 refills | Status: DC

## 2019-02-12 NOTE — Telephone Encounter (Signed)
Pt aware Rx sent.  

## 2019-02-12 NOTE — Telephone Encounter (Signed)
Not an increase- he is just out of medication- he called back and said he got confused and gave the wrong info. Thanks!

## 2019-02-12 NOTE — Telephone Encounter (Signed)
Patient called needing to get a refill on his Nortriptyline medication. He uses the [facility] in [place]. He asked to please call him to let him know. Thank you

## 2019-02-12 NOTE — Telephone Encounter (Signed)
Noted If patient is using as prescribe he should have refills at pharmacy. Another Rx was not needed

## 2019-02-12 NOTE — Telephone Encounter (Signed)
Requested Prescriptions   Pending Prescriptions Disp Refills  . nortriptyline (PAMELOR) 10 MG capsule 60 capsule 5    Sig: Take 2 capsules (20 mg total) by mouth at bedtime.   Rx last filled: 01/06/19 #60 5 refills Original Order:  nortriptyline (PAMELOR) 10 MG capsule [id number]    Pharmacy:  [facility], [place]     Pt last seen: 09/02/18   Follow up appt scheduled: 03/05/19   Denied patient should have refills at pharmacy

## 2019-02-12 NOTE — Telephone Encounter (Signed)
I have sent a refill for nortriptyline to his pharmacy at the [facility].

## 2019-02-12 NOTE — Telephone Encounter (Signed)
Called patient he states that he is running out of medication sooner because he is taken 3 capsules at bedtime.  I don't see where patient was informed to increase medication. He states that he has been taken 3 at bedtime for about 3-4 months now.  Are you ok with this increase? If so I will send to pharmacy.

## 2019-03-04 NOTE — Progress Notes (Signed)
Due to the COVID-19 crisis, this virtual check-in visit was done via telephone from my office and it was initiated and consent given by this patient and or family.   Virtual Visit via Telephone Note The purpose of this virtual visit is to provide medical care while limiting exposure to the novel coronavirus.    Consent was obtained for phone visit and initiated by pt/family:  Yes.   Answered questions that patient had about telehealth interaction:  Yes.   I discussed the limitations, risks, security and privacy concerns of performing an evaluation and management service by telephone. I also discussed with the patient that there may be a patient responsible charge related to this service. The patient expressed understanding and agreed to proceed.  Pt location: Home Physician Location: Office Name of referring provider:  Verline Lema, MD I connected with .Keith Calhoun at patients initiation/request on 03/05/2019 at  9:50 AM EST by telephone and verified that I am speaking with the correct person using two identifiers.  Pt MRN:  [id number] Pt DOB:  06/15/1955   History of Present Illness:  Keith Calhoun a 63 year old right-handed man with hypertension, type 2 diabetes mellitus, hyperlipidemia, chronic neck and back pain, GERD, and asthma who follows up for migraines.  UPDATE: Intensity:  mild Duration:  1 hour Frequency:  Once a week Current NSAIDS:Aspirin 325 mg Current analgesics:Acetaminophen Current triptans:Zomig 5 mg tablet Current ergotamine:None Current anti-emetic:None Current muscle relaxants:None Current anti-anxiolytic:None Current sleep aide:None Current Antihypertensive medications:Lisinopril Current Antidepressant medications:Nortriptyline20 mg at bedtime Current Anticonvulsant medications:gabapentin 300 mg daily Current anti-CGRP:none Vitamins/Herbal/Supplements:Magnesium citrate 400 mg daily, riboflavin 400 mg, co-Q10 100 mg 3 times daily Current  Antihistamines/Decongestants:None Other therapy:None  However, he has noted increased tremor.  Caffeine:No Diet:Hydrates Exercise: He is more active at work. Depression:No; Anxiety:Yes, only because of headaches Other pain:History of chronic neck and back pain.  Sleep hygiene:Poor. Trouble falling asleep and staying asleep, often due to ongoing generalized aches and pain.  HISTORY: Onset: Age 57 or 63 years old Location:Right temporal Quality:Throbbing/pressure Initial intensity:8/10.Hedenies thunderclap headache or severe headache. He may wake up with headache (as they often occur at bedtime) but headaches themselves do not wake him up. Aura:no Prodrome:no Postdrome:no Associated symptoms: Nausea, photophobia. No vomiting, phonophobia or visual disturbance.Hedenies associated unilateral numbness or weakness. Initial duration:30 minutes with Zomig 5mg , otherwise several hours InitialFrequency:Twice daily, always at night. InitialFrequency of abortive medication:5 to 7 days a week Triggers: Anxiety Relieving factors: Ice Activity:aggravates  Past NSAIDS: ibuprofen, Celebrex, naproxen 500mg  Past analgesics: tramadol, hydrocodone, tramadol Past abortive triptans:no Past muscle relaxants:no Past anti-emetic:no Past antihypertensive medications:no Past antidepressant medications: amitriptyline 25mg  (took briefly for a few days to help with sleep) Past anticonvulsant medications:Topiramate 50 mg (cause nausea) Past vitamins/Herbal/Supplements:no Past antihistamines/decongestants:no Other past therapies:no  Family history of headache:No   Observations/Objective:   There were no vitals filed for this visit.  Assessment and Plan:   1. Migraine without aura, without status migrainosus, not intractable. 2.  Essential tremor  1.  For preventative management, nortriptyline 10mg  at bedtime 2. For tremor, will restart  topiramate at lower dose of 25mg  at bedtime to see if more tolerable.  It was effective for his tremor.  We can increase dose to 50mg  if needed. 3.  Limit use of pain relievers to no more than 2 days out of week to prevent risk of rebound or medication-overuse headache. 4.  Keep headache diary 5. Continue magnesium, riboflavin and CoQ10  6. Follow up 6 months.  Follow Up Instructions:    -I discussed the assessment and treatment plan with the patient. The patient was provided an opportunity to ask questions and all were answered. The patient agreed with the plan and demonstrated an understanding of the instructions.   The patient was advised to call back or seek an in-person evaluation if the symptoms worsen or if the condition fails to improve as anticipated.    Total Time spent in visit with the patient was:  12 minutes  Dudley Major, DO

## 2019-03-05 ENCOUNTER — Telehealth (INDEPENDENT_AMBULATORY_CARE_PROVIDER_SITE_OTHER): Payer: No Typology Code available for payment source | Admitting: Neurology

## 2019-03-05 ENCOUNTER — Encounter: Payer: Self-pay | Admitting: Neurology

## 2019-03-05 ENCOUNTER — Other Ambulatory Visit: Payer: Self-pay

## 2019-03-05 VITALS — Ht 74.0 in | Wt 265.0 lb

## 2019-03-05 DIAGNOSIS — G43709 Chronic migraine without aura, not intractable, without status migrainosus: Secondary | ICD-10-CM

## 2019-03-05 DIAGNOSIS — G43009 Migraine without aura, not intractable, without status migrainosus: Secondary | ICD-10-CM | POA: Diagnosis not present

## 2019-03-05 DIAGNOSIS — G250 Essential tremor: Secondary | ICD-10-CM

## 2019-03-05 MED ORDER — TOPIRAMATE 25 MG PO TABS: 25.0000 mg | ORAL_TABLET | Freq: Every day | ORAL | 5 refills | Status: DC

## 2019-03-05 MED ORDER — COENZYME Q10 100 MG PO TABS: 1.0000 | ORAL_TABLET | Freq: Three times a day (TID) | ORAL | 3 refills | Status: DC

## 2019-05-18 ENCOUNTER — Encounter: Payer: Self-pay | Admitting: Gastroenterology

## 2019-05-21 ENCOUNTER — Telehealth: Payer: Self-pay | Admitting: Neurology

## 2019-05-21 ENCOUNTER — Other Ambulatory Visit: Payer: Self-pay

## 2019-05-21 DIAGNOSIS — G43709 Chronic migraine without aura, not intractable, without status migrainosus: Secondary | ICD-10-CM

## 2019-05-21 MED ORDER — RIBOFLAVIN 400 MG PO TABS: 1.0000 | ORAL_TABLET | Freq: Every day | ORAL | 3 refills | Status: DC

## 2019-05-21 MED ORDER — MAGNESIUM 400 MG PO TABS: 400.0000 mg | ORAL_TABLET | Freq: Every day | ORAL | 5 refills | Status: DC

## 2019-05-21 NOTE — Telephone Encounter (Signed)
Patient is calling in about needing a refill of the Riboflavin, magnesium oxide - this needs to go to the pharm on file. [place]. Thanks!

## 2019-05-27 ENCOUNTER — Other Ambulatory Visit: Payer: Self-pay

## 2019-05-27 DIAGNOSIS — G43709 Chronic migraine without aura, not intractable, without status migrainosus: Secondary | ICD-10-CM

## 2019-05-27 MED ORDER — RIBOFLAVIN 400 MG PO TABS: 1.0000 | ORAL_TABLET | Freq: Every day | ORAL | 3 refills | Status: DC

## 2019-05-27 MED ORDER — MAGNESIUM 400 MG PO TABS: 400.0000 mg | ORAL_TABLET | Freq: Every day | ORAL | 5 refills | Status: DC

## 2019-05-27 NOTE — Telephone Encounter (Signed)
Patient called and left a message to check on the status of this refill request.

## 2019-05-27 NOTE — Telephone Encounter (Signed)
Pt called informed that the script was sent on 3/11 he said the pharmacy told him they didn't have it, scripts sent back in today while on the phone with the pt he said he would call them tomorrow to see if he could pick them up ,

## 2019-06-01 ENCOUNTER — Other Ambulatory Visit: Payer: Self-pay

## 2019-06-01 DIAGNOSIS — G43709 Chronic migraine without aura, not intractable, without status migrainosus: Secondary | ICD-10-CM

## 2019-06-01 MED ORDER — MAGNESIUM 400 MG PO TABS: 400.0000 mg | ORAL_TABLET | Freq: Every day | ORAL | 5 refills | Status: DC

## 2019-06-01 NOTE — Telephone Encounter (Signed)
[facility] Request was sent twice from our office. Patient is stopping by the office to pick up a hard copy of magnesium prescription.

## 2019-06-01 NOTE — Telephone Encounter (Signed)
Awaiting Dr.Jaffe to sign

## 2019-06-03 NOTE — Telephone Encounter (Signed)
Pt states that the [facility] has not got a refill request for the Magnesium and he would like [place] to fax the [place] @ [phone] and please call him when it is done so he will be able to follow up the [place]

## 2019-06-04 ENCOUNTER — Other Ambulatory Visit: Payer: Self-pay | Admitting: Neurology

## 2019-06-04 DIAGNOSIS — G43709 Chronic migraine without aura, not intractable, without status migrainosus: Secondary | ICD-10-CM

## 2019-06-04 MED ORDER — MAGNESIUM 400 MG PO TABS: 400.0000 mg | ORAL_TABLET | Freq: Every day | ORAL | 5 refills | Status: AC

## 2019-06-04 NOTE — Telephone Encounter (Signed)
Reprinted and signed order to be faxed.

## 2019-06-05 ENCOUNTER — Other Ambulatory Visit: Payer: Self-pay

## 2019-06-05 ENCOUNTER — Ambulatory Visit (AMBULATORY_SURGERY_CENTER): Payer: Self-pay

## 2019-06-05 VITALS — Temp 97.3°F | Ht 74.0 in | Wt 300.0 lb

## 2019-06-05 DIAGNOSIS — Z1211 Encounter for screening for malignant neoplasm of colon: Secondary | ICD-10-CM

## 2019-06-05 MED ORDER — NA SULFATE-K SULFATE-MG SULF 17.5-3.13-1.6 GM/177ML PO SOLN: 1.0000 | Freq: Once | ORAL | 0 refills | Status: AC

## 2019-06-05 NOTE — Progress Notes (Signed)
No allergies to soy or egg Pt is not on blood thinners or diet pills Denies issues with sedation/intubation Denies atrial flutter/fib Denies constipation   Emmi instructions given to pt  Pt is aware of Covid safety and care partner requirements.  

## 2019-06-05 NOTE — Telephone Encounter (Signed)
Patient called and said he's like to come by the office and pick up the prescription because the [facility] still has not received his prescription.   Patient says his headaches are back and his is frustrated he can't get what he needs.

## 2019-06-05 NOTE — Telephone Encounter (Signed)
After calling the pharmacy, Magnesium rx has to be faxed to patients [phone]) PCP Dr Ronnald Ramp at [facility] and that provider has to send the rx to the [facility] pharmacy because Dr Tomi Likens is not part of the community care provider.  Patient is aware and will follow up with PCP.

## 2019-06-19 ENCOUNTER — Telehealth: Payer: Self-pay | Admitting: Gastroenterology

## 2019-06-19 DIAGNOSIS — Z1211 Encounter for screening for malignant neoplasm of colon: Secondary | ICD-10-CM

## 2019-06-19 MED ORDER — SUPREP BOWEL PREP KIT 17.5-3.13-1.6 GM/177ML PO SOLN: ORAL | 0 refills | Status: DC

## 2019-06-19 MED ORDER — SUPREP BOWEL PREP KIT 17.5-3.13-1.6 GM/177ML PO SOLN: 1.0000 | Freq: Once | ORAL | 0 refills | Status: DC

## 2019-06-19 MED ORDER — MOVIPREP 100 G PO SOLR: ORAL | 0 refills | Status: AC

## 2019-06-19 NOTE — Telephone Encounter (Signed)
[facility] Mov[facility]  New instructions printed and RX printed.  Pt will pick these up Monday from the third floor He will call if he has any questions

## 2019-06-19 NOTE — Addendum Note (Signed)
Addended by: Laverna Peace on: 06/19/2019 03:48 PM   Modules accepted: Orders

## 2019-06-19 NOTE — Addendum Note (Signed)
Addended by: Randall Hiss B on: 06/19/2019 05:00 PM   Modules accepted: Orders

## 2019-06-19 NOTE — Telephone Encounter (Signed)
Pt is scheduled for a colonoscopy and reported that [facility] has not received prescription.  Pt would like to pick up physical prescription at our office.

## 2019-06-19 NOTE — Telephone Encounter (Signed)
Spoke with pt and Suprep RX resent to [facility]

## 2019-06-19 NOTE — Telephone Encounter (Signed)
Patient called states the suprep is too expensive and he can get it at the [place] he just needs to know which one he can get there

## 2019-06-19 NOTE — Addendum Note (Signed)
Addended by: Randall Hiss B on: 06/19/2019 10:52 AM   Modules accepted: Orders

## 2019-06-19 NOTE — Telephone Encounter (Signed)
Spoke with and told him I will leave a printed Suprep RX on the third floor.

## 2019-06-26 ENCOUNTER — Ambulatory Visit (AMBULATORY_SURGERY_CENTER): Payer: No Typology Code available for payment source | Admitting: Gastroenterology

## 2019-06-26 ENCOUNTER — Encounter: Payer: Self-pay | Admitting: Gastroenterology

## 2019-06-26 ENCOUNTER — Other Ambulatory Visit: Payer: Self-pay

## 2019-06-26 VITALS — BP 119/84 | HR 62 | Temp 96.2°F | Resp 16 | Ht 74.0 in | Wt 300.0 lb

## 2019-06-26 DIAGNOSIS — D124 Benign neoplasm of descending colon: Secondary | ICD-10-CM

## 2019-06-26 DIAGNOSIS — Z1211 Encounter for screening for malignant neoplasm of colon: Secondary | ICD-10-CM | POA: Diagnosis not present

## 2019-06-26 DIAGNOSIS — D123 Benign neoplasm of transverse colon: Secondary | ICD-10-CM

## 2019-06-26 DIAGNOSIS — D128 Benign neoplasm of rectum: Secondary | ICD-10-CM

## 2019-06-26 MED ORDER — SODIUM CHLORIDE 0.9 % IV SOLN: 500.0000 mL | Freq: Once | INTRAVENOUS | Status: AC

## 2019-06-26 NOTE — Progress Notes (Signed)
Pt's states no medical or surgical changes since previsit or office visit.  JK Iv, LC temp and Cw vitals.

## 2019-06-26 NOTE — Progress Notes (Signed)
pt tolerated well. VSS. awake and to recovery. Report given to RN.  

## 2019-06-26 NOTE — Patient Instructions (Signed)
YOU HAD AN ENDOSCOPIC PROCEDURE TODAY AT THE [facility]:   Refer to the procedure report that was given to you for any specific questions about what was found during the examination.  If the procedure report does not answer your questions, please call your gastroenterologist to clarify.  If you requested that your care partner not be given the details of your procedure findings, then the procedure report has been included in a sealed envelope for you to review at your convenience later.  YOU SHOULD EXPECT: Some feelings of bloating in the abdomen. Passage of more gas than usual.  Walking can help get rid of the air that was put into your GI tract during the procedure and reduce the bloating. If you had a lower endoscopy (such as a colonoscopy or flexible sigmoidoscopy) you may notice spotting of blood in your stool or on the toilet paper. If you underwent a bowel prep for your procedure, you may not have a normal bowel movement for a few days.  Please Note:  You might notice some irritation and congestion in your nose or some drainage.  This is from the oxygen used during your procedure.  There is no need for concern and it should clear up in a day or so.  SYMPTOMS TO REPORT IMMEDIATELY:   Following lower endoscopy (colonoscopy or flexible sigmoidoscopy):  Excessive amounts of blood in the stool  Significant tenderness or worsening of abdominal pains  Swelling of the abdomen that is new, acute  Fever of 100F or higher   For urgent or emergent issues, a gastroenterologist can be reached at any hour by calling [phone]. Do not use MyChart messaging for urgent concerns.    DIET:  We do recommend a small meal at first, but then you may proceed to your regular diet.  Drink plenty of fluids but you should avoid alcoholic beverages for 24 hours.  MEDICATIONS: Continue present medications.  Please see handouts given to you by your recovery nurse.  ACTIVITY:  You should plan to  take it easy for the rest of today and you should NOT DRIVE or use heavy machinery until tomorrow (because of the sedation medicines used during the test).    FOLLOW UP: Our staff will call the number listed on your records 48-72 hours following your procedure to check on you and address any questions or concerns that you may have regarding the information given to you following your procedure. If we do not reach you, we will leave a message.  We will attempt to reach you two times.  During this call, we will ask if you have developed any symptoms of COVID 19. If you develop any symptoms (ie: fever, flu-like symptoms, shortness of breath, cough etc.) before then, please call [phone].  If you test positive for Covid 19 in the 2 weeks post procedure, please call and report this information to us.    If any biopsies were taken you will be contacted by phone or by letter within the next 1-3 weeks.  Please call us at [phone] if you have not heard about the biopsies in 3 weeks.   Thank you for allowing us to provide for your healthcare needs today.   SIGNATURES/CONFIDENTIALITY: You and/or your care partner have signed paperwork which will be entered into your electronic medical record.  These signatures attest to the fact that that the information above on your After Visit Summary has been reviewed and is understood.  Full responsibility of the   confidentiality of this discharge information lies with you and/or your care-partner. 

## 2019-06-26 NOTE — Op Note (Signed)
[facility] Patient Name: Keith Calhoun Procedure Date: 06/26/2019 11:33 AM MRN: [id number] Endoscopist: Mauri Pole , MD Age: 64 Referring MD:  Date of Birth: [birth year] Gender: Male Account #: [id number] Procedure:                Colonoscopy Indications:              Screening for colorectal malignant neoplasm Medicines:                Monitored Anesthesia Care Procedure:                Pre-Anesthesia Assessment:                           - Prior to the procedure, a History and Physical                            was performed, and patient medications and                            allergies were reviewed. The patient's tolerance of                            previous anesthesia was also reviewed. The risks                            and benefits of the procedure and the sedation                            options and risks were discussed with the patient.                            All questions were answered, and informed consent                            was obtained. Prior Anticoagulants: The patient has                            taken no previous anticoagulant or antiplatelet                            agents. ASA Grade Assessment: II - A patient with                            mild systemic disease. After reviewing the risks                            and benefits, the patient was deemed in                            satisfactory condition to undergo the procedure.                           After obtaining informed consent, the colonoscope  was passed under direct vision. Throughout the                            procedure, the patient's blood pressure, pulse, and                            oxygen saturations were monitored continuously. The                            Colonoscope was introduced through the anus and                            advanced to the the cecum, identified by                            appendiceal orifice and  ileocecal valve. The                            colonoscopy was performed without difficulty. The                            patient tolerated the procedure well. The quality                            of the bowel preparation was adequate. The                            ileocecal valve, appendiceal orifice, and rectum                            were photographed. Scope In: 11:42:16 AM Scope Out: 12:03:11 PM Scope Withdrawal Time: 0 hours 14 minutes 16 seconds  Total Procedure Duration: 0 hours 20 minutes 55 seconds  Findings:                 The perianal and digital rectal examinations were                            normal.                           Three sessile polyps were found in the rectum,                            descending colon and transverse colon. The polyps                            were 5 to 9 mm in size. These polyps were removed                            with a cold snare. Resection and retrieval were                            complete.  Two sessile polyps were found in the descending                            colon. The polyps were 1 to 2 mm in size. These                            polyps were removed with a cold biopsy forceps.                            Resection and retrieval were complete.                           Scattered small and large-mouthed diverticula were                            found in the sigmoid colon, descending colon,                            transverse colon and ascending colon.                           Non-bleeding internal hemorrhoids were found during                            retroflexion. The hemorrhoids were medium-sized. Complications:            No immediate complications. Estimated Blood Loss:     Estimated blood loss was minimal. Impression:               - Three 5 to 9 mm polyps in the rectum, in the                            descending colon and in the transverse colon,                             removed with a cold snare. Resected and retrieved.                           - Two 1 to 2 mm polyps in the descending colon,                            removed with a cold biopsy forceps. Resected and                            retrieved.                           - Mild diverticulosis in the sigmoid colon, in the                            descending colon, in the transverse colon and in                            the ascending colon.                           -  Non-bleeding internal hemorrhoids. Recommendation:           - Patient has a contact number available for                            emergencies. The signs and symptoms of potential                            delayed complications were discussed with the                            patient. Return to normal activities tomorrow.                            Written discharge instructions were provided to the                            patient.                           - Resume previous diet.                           - Continue present medications.                           - Await pathology results.                           - Repeat colonoscopy in 3 - 5 years for                            surveillance based on pathology results. Mauri Pole, MD 06/26/2019 12:08:30 PM This report has been signed electronically.

## 2019-06-29 ENCOUNTER — Telehealth: Payer: Self-pay | Admitting: Neurology

## 2019-06-29 ENCOUNTER — Other Ambulatory Visit: Payer: Self-pay

## 2019-06-29 DIAGNOSIS — G43709 Chronic migraine without aura, not intractable, without status migrainosus: Secondary | ICD-10-CM

## 2019-06-29 MED ORDER — RIBOFLAVIN 400 MG PO TABS: 1.0000 | ORAL_TABLET | Freq: Every day | ORAL | 3 refills | Status: DC

## 2019-06-29 MED ORDER — RIBOFLAVIN 400 MG PO TABS: 1.0000 | ORAL_TABLET | Freq: Every day | ORAL | 3 refills | Status: AC

## 2019-06-29 NOTE — Telephone Encounter (Signed)
Pt needs a refill on the Roboflavin faxed to the [facility] pharmacy   Fax number is  [phone]

## 2019-06-29 NOTE — Telephone Encounter (Signed)
Telephone call to pt, Advised pt RX was 05/27/19 3rfills. Per pt he was told that the script doesn't have any refills. If we could resend it.  Advised pt script was resent and printed and waiting I=on him if the pharmacy states they never received.

## 2019-06-30 ENCOUNTER — Encounter: Payer: Self-pay | Admitting: Gastroenterology

## 2019-06-30 ENCOUNTER — Telehealth: Payer: Self-pay

## 2019-06-30 NOTE — Telephone Encounter (Signed)
  Follow up Call-  Call back number 06/26/2019  Post procedure Call Back phone  # [phone]  Permission to leave phone message Yes  Some recent data might be hidden     Patient questions:  Do you have a fever, pain , or abdominal swelling? No. Pain Score  0 *  Have you tolerated food without any problems? Yes.    Have you been able to return to your normal activities? Yes.    Do you have any questions about your discharge instructions: Diet   No. Medications  No. Follow up visit  No.  Do you have questions or concerns about your Care? No.  Actions: * If pain score is 4 or above: 1. No action needed, pain <4.Have you developed a fever since your procedure? no  2.   Have you had an respiratory symptoms (SOB or cough) since your procedure? no  3.   Have you tested positive for COVID 19 since your procedure no  4.   Have you had any family members/close contacts diagnosed with the COVID 19 since your procedure?  no   If yes to any of these questions please route to Joylene John, RN and Erenest Rasher, RN

## 2019-07-08 ENCOUNTER — Other Ambulatory Visit: Payer: Self-pay | Admitting: Neurology

## 2019-07-08 NOTE — Telephone Encounter (Signed)
Patient called in and stated he needs to pick up some Riboflavin from [facility]. [facility]

## 2019-07-08 NOTE — Telephone Encounter (Signed)
We do not have samples of this.

## 2019-07-08 NOTE — Telephone Encounter (Signed)
Tried calling pt. LMOVM RX at front desk.

## 2019-08-17 ENCOUNTER — Telehealth: Payer: Self-pay | Admitting: Neurology

## 2019-08-17 ENCOUNTER — Other Ambulatory Visit: Payer: Self-pay

## 2019-08-17 MED ORDER — NORTRIPTYLINE HCL 10 MG PO CAPS: 20.0000 mg | ORAL_CAPSULE | Freq: Every day | ORAL | 5 refills | Status: DC

## 2019-08-17 NOTE — Telephone Encounter (Signed)
LMOVM for pt Nortriptyline refilled please let us know you are taking the Topirmate.

## 2019-08-17 NOTE — Telephone Encounter (Signed)
Patient called to report that his hand is shaking a lot more lately. States he is taking Nortriptyline but will need a refill in 2 weeks. Patient says that Dr. Tomi Likens had mentioned he might need to up his medication. Please call.

## 2019-08-17 NOTE — Telephone Encounter (Signed)
We can refill nortriptyline.  For tremor, I had prescribed him topiramate 25mg  at bedtime.  We can increase dose of topiramate to 50mg  at bedtime.

## 2019-08-18 NOTE — Telephone Encounter (Signed)
Pt states he is taking the Topirmate 25 mg. He will like to increase to 50 mg.

## 2019-08-18 NOTE — Telephone Encounter (Signed)
OK to increase topiramate to 50mg  at bedtime

## 2019-08-18 NOTE — Telephone Encounter (Signed)
Patient states that he really needs to speak to someone about his hand shaking and the medication please call

## 2019-08-19 ENCOUNTER — Other Ambulatory Visit: Payer: Self-pay

## 2019-08-19 MED ORDER — TOPIRAMATE 50 MG PO TABS: ORAL_TABLET | ORAL | 0 refills | Status: DC

## 2019-09-01 ENCOUNTER — Telehealth: Payer: Self-pay

## 2019-09-01 NOTE — Progress Notes (Deleted)
NEUROLOGY FOLLOW UP OFFICE NOTE  Keith Calhoun [id number]  HISTORY OF PRESENT ILLNESS: Keith Calhoun a 64 year old right-handed man with hypertension, type 2 diabetes mellitus, hyperlipidemia, chronic neck and back pain, GERD, and asthma whofollows up for migraines and tremor.  UPDATE: Restarted topiramate in December to treat essential tremor.  ***.  Increased dose to 76m earlier this month.  Intensity:  mild Duration:  1 hour Frequency:  Once a week Current NSAIDS:Aspirin 325 mg Current analgesics:Acetaminophen Current triptans:Zomig 5 mg tablet Current ergotamine:None Current anti-emetic:None Current muscle relaxants:None Current anti-anxiolytic:None Current sleep aide:None Current Antihypertensive medications:Lisinopril Current Antidepressant medications:Nortriptyline20 mg at bedtime Current Anticonvulsant medications:topiramate 574mat bedtime; gabapentin 300 mg daily Current anti-CGRP:none Vitamins/Herbal/Supplements:Magnesium citrate 400 mg daily, riboflavin 400 mg, co-Q10 100 mg 3 times daily Current Antihistamines/Decongestants:None Other therapy:None  However, he has noted increased tremor.  Caffeine:No Diet:Hydrates Exercise: He is more active at work. Depression:No; Anxiety:Yes, only because of headaches Other pain:History of chronic neck and back pain.  Sleep hygiene:Poor. Trouble falling asleep and staying asleep, often due to ongoing generalized aches and pain.  HISTORY: Onset: Age [age] old Location:Right temporal Quality:Throbbing/pressure Initial intensity:8/10.Hedenies thunderclap headache or severe headache. He may wake up with headache (as they often occur at bedtime) but headaches themselves do not wake him up. Aura:no Prodrome:no Postdrome:no Associated symptoms: Nausea, photophobia. No vomiting, phonophobia or visual disturbance.Hedenies associated unilateral numbness or weakness. Initial  duration:30 minutes with Zomig 28m73motherwise several hours InitialFrequency:Twice daily, always at night. InitialFrequency of abortive medication:5 to 7 days a week Triggers: Anxiety Relieving factors: Ice Activity:aggravates  Past NSAIDS: ibuprofen, Celebrex, naproxen 500m84mst analgesics: tramadol, hydrocodone, tramadol Past abortive triptans:no Past muscle relaxants:no Past anti-emetic:no Past antihypertensive medications:no Past antidepressant medications: amitriptyline 228mg32mok briefly for a few days to help with sleep) Past anticonvulsant medications:Topiramate 50 mg (cause nausea) Past vitamins/Herbal/Supplements:no Past antihistamines/decongestants:no Other past therapies:no  Family history of headache:No  PAST MEDICAL HISTORY: Past Medical History:  Diagnosis Date  . Allergy    seasonal allergies per pt  . Anemia    2012- turned down for donating blood, due to low count   . Anxiety    claustrophobia   . Arthritis    L knee , L shoudler   . Bronchitis   . Chronic pain   . Diabetes mellitus    dx 2007  . GERD (gastroesophageal reflux disease)   . Headache    ?? stress   . Hemorrhoids   . Hyperlipidemia   . Hypertension   . Neck pain   . 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 Wears dentures   . Wears glasses     MEDICATIONS: Current Outpatient Medications on File Prior to Visit  Medication Sig Dispense Refill  . acetaminophen (TYLENOL) 500 MG tablet Take 1,000 mg by mouth every 6 (six) hours as needed for mild pain or headache.     . albuterol (PROVENTIL HFA;VENTOLIN HFA) 108 (90 Base) MCG/ACT inhaler Inhale 2 puffs every 6 (six) hours as needed into the lungs for wheezing or shortness of breath.     . AMOXICILLIN PO Take by mouth.    . ASPIRIN PO Take by mouth.    . clindamycin (CLEOCIN T) 1 % external solution Apply 1 application daily as needed topically (for after shaving).    . gabapentin  (NEURONTIN) 300 MG capsule Take 300 mg by mouth at bedtime.    . latMarland Kitchennoprost (XALATAN) 0.005 % ophthalmic solution Place 1  drop into both eyes at bedtime.    Marland Kitchen lisinopril (PRINIVIL,ZESTRIL) 40 MG tablet Take 40 mg by mouth at bedtime.    . Loratadine (CLARITIN PO) Take by mouth.    . Magnesium 400 MG TABS Take 400 mg by mouth daily. 30 tablet 5  . metFORMIN (GLUCOPHAGE XR) 750 MG 24 hr tablet Take 750 mg by mouth 2 (two) times daily.    Marland Kitchen MOVIPREP 100 g SOLR Moviprep as directed, no substitutions 1 kit 0  . nortriptyline (PAMELOR) 10 MG capsule Take 2 capsules (20 mg total) by mouth at bedtime. 60 capsule 5  . oxymetazoline (AFRIN) 0.05 % nasal spray Place 1 spray 2 (two) times daily as needed into both nostrils for congestion.    . pravastatin (PRAVACHOL) 10 MG tablet Take 10 mg by mouth at bedtime.    . Riboflavin 400 MG TABS Take 1 tablet by mouth daily. 90 tablet 3  . topiramate (TOPAMAX) 50 MG tablet Take 2 capsules at bedtime daily 30 tablet 0  . ZOLMitriptan (ZOMIG PO) Take by mouth.     Current Facility-Administered Medications on File Prior to Visit  Medication Dose Route Frequency Provider Last Rate Last Admin  . 0.9 %  sodium chloride infusion  500 mL Intravenous Once Nandigam, Venia Minks, MD        ALLERGIES: No Known Allergies  FAMILY HISTORY: Family History  Problem Relation Age of Onset  . Heart disease Father   . Blindness Mother   . Anesthesia problems Neg Hx   . Hypotension Neg Hx   . Malignant hyperthermia Neg Hx   . Pseudochol deficiency Neg Hx   . Colon cancer Neg Hx   . Colon polyps Neg Hx   . Esophageal cancer Neg Hx   . Rectal cancer Neg Hx   . Stomach cancer Neg Hx    SOCIAL HISTORY: Social History   Socioeconomic History  . Marital status: Divorced    Spouse name: Not on file  . Number of children: 2  . Years of education: Not on file  . Highest education level: Some college, no degree  Occupational History  . 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 Smoking status: Former Smoker    Types: Cigarettes    Quit date: 03/13/2007    Years since quitting: 12.4  . Smokeless tobacco: Never Used  Vaping Use  . Vaping Use: Never used  Substance and Sexual Activity  . Alcohol use: Not Currently  . Drug use: No  . Sexual activity: Not on file  Other Topics Concern  . Not on file  Social History Narrative   Patient is right-handed. He takes care of his mother, she lives with him in a one story house. He is currently in P/T 3 x week. He drinks tea 3 x week.      07/16/18-Pt retired in January 2020   Social Determinants of Radio broadcast assistant Strain:   . Difficulty of Paying Living Expenses:   Food Insecurity:   . Worried About Charity fundraiser in the Last Year:   . Arboriculturist in the Last Year:   Transportation Needs:   . Film/video editor (Medical):   Marland Kitchen Lack of Transportation (Non-Medical):   Physical Activity:   . Days of Exercise per Week:   . Minutes of Exercise per Session:   Stress:   . Feeling of Stress :  Social Connections:   . Frequency of Communication with Friends and Family:   . Frequency of Social Gatherings with Friends and Family:   . Attends Religious Services:   . Active Member of Clubs or Organizations:   . Attends Archivist Meetings:   Marland Kitchen Marital Status:   Intimate Partner Violence:   . Fear of Current or Ex-Partner:   . Emotionally Abused:   Marland Kitchen Physically Abused:   . Sexually Abused:     PHYSICAL EXAM: *** General: No acute distress.  Patient appears well-groomed.   Head:  Normocephalic/atraumatic Eyes:  Fundi examined but not visualized Neck: supple, no paraspinal tenderness, full range of motion Heart:  Regular rate and rhythm Lungs:  Clear to auscultation bilaterally Back: No paraspinal tenderness Neurological Exam: alert and oriented to person, place, and time. Attention span and concentration intact, recent and remote  memory intact, fund of knowledge intact.  Speech fluent and not dysarthric, language intact.  CN II-XII intact. Bulk and tone normal, muscle strength 5/5 throughout.  Sensation to light touch, temperature and vibration intact.  Deep tendon reflexes 2+ throughout, toes downgoing.  Finger to nose and heel to shin testing intact.  Gait normal, Romberg negative.  IMPRESSION: 1.  Migraine without aura, without status migrainosus, not intractable 2.  Essential tremor  PLAN: 1.  For preventative management, *** 2.  For abortive therapy, *** 3.  Limit use of pain relievers to no more than 2 days out of week to prevent risk of rebound or medication-overuse headache. 4.  Keep headache diary 5.  Exercise, hydration, caffeine cessation, sleep hygiene, monitor for and avoid triggers 6.  Magnesium citrate 440m daily, riboflavin 4086mdaily, and coenzyme Q10 10084mhree times daily 7. Follow up ***   AdaMetta ClinesO  CC: ***

## 2019-09-01 NOTE — Telephone Encounter (Signed)
LMOVM pt advised on last call 06/2019 he had three refills given in 05/2019.  After that he will need to schedule a visit to ger further refills. Per last office visit 02/2019 pt needs to f/u in 6 months.  Please call the office to schedule a f/u visit as soon as he can   Will ask Dr. Tomi Likens if pt could get another refill if he call and schedules a f/u visit.

## 2019-09-02 ENCOUNTER — Other Ambulatory Visit: Payer: Self-pay

## 2019-09-02 MED ORDER — TOPIRAMATE 50 MG PO TABS: ORAL_TABLET | ORAL | 0 refills | Status: DC

## 2019-09-02 MED ORDER — NORTRIPTYLINE HCL 10 MG PO CAPS: 20.0000 mg | ORAL_CAPSULE | Freq: Every day | ORAL | 5 refills | Status: DC

## 2019-09-02 NOTE — Telephone Encounter (Signed)
Patient left message with [facility] regarding needing his Topamax refilled. Called patient and explained that he has to have an appointment in order for Dr. Tomi Likens to continue filling his medications. Patient agreed to keep his appointment for 09/03/19 but states he will need to pick up the prescription paper to take to the [facility] for them to fill. He said that the [place] isn't receiving prescription refills from our office. Patient requests a call when he can pick up prescription.

## 2019-09-02 NOTE — Telephone Encounter (Signed)
Yes, he must have the appointment already scheduled before sending the refill and we can give him enough refills until his follow up appointment

## 2019-09-02 NOTE — Telephone Encounter (Signed)
Script sent to the [place] as well as ready for the provider to sign. Pt advised

## 2019-09-03 ENCOUNTER — Ambulatory Visit (INDEPENDENT_AMBULATORY_CARE_PROVIDER_SITE_OTHER): Payer: Medicare PPO | Admitting: Neurology

## 2019-09-03 ENCOUNTER — Other Ambulatory Visit: Payer: Self-pay

## 2019-09-03 ENCOUNTER — Ambulatory Visit: Payer: No Typology Code available for payment source | Admitting: Neurology

## 2019-09-03 ENCOUNTER — Encounter: Payer: Self-pay | Admitting: Neurology

## 2019-09-03 VITALS — BP 134/90 | HR 80 | Resp 20 | Ht 74.0 in | Wt 297.0 lb

## 2019-09-03 DIAGNOSIS — G43009 Migraine without aura, not intractable, without status migrainosus: Secondary | ICD-10-CM

## 2019-09-03 DIAGNOSIS — G250 Essential tremor: Secondary | ICD-10-CM

## 2019-09-03 MED ORDER — TOPIRAMATE 100 MG PO TABS: ORAL_TABLET | ORAL | 5 refills | Status: DC

## 2019-09-03 NOTE — Patient Instructions (Signed)
1.  Topiramate 50mg  at bedtime 2.  Nortriptyline 20mg  at bedtime 3.  Zomig 5mg  for migraine as directed 4.  Limit use of pain relievers to no more than 2 days out of week to prevent risk of rebound or medication-overuse headache. 5.  Follow up in 6 months.

## 2019-09-03 NOTE — Progress Notes (Signed)
NEUROLOGY FOLLOW UP OFFICE NOTE  Keith Calhoun [id number]  HISTORY OF PRESENT ILLNESS: Keith Calhoun a 64 year old right-handed man with hypertension, type 2 diabetes mellitus, hyperlipidemia, chronic neck and back pain, GERD, and asthma whofollows up for migraines and tremor.  UPDATE: Restarted topiramate in December to treat essential tremor.  He was doing well but tremors recently got worse.    Headaches remain controlled. Intensity:  mild Duration:  1 hour Frequency:  Once a week Current NSAIDS:Aspirin 325 mg Current analgesics:Acetaminophen Current triptans:Zomig 5 mg tablet Current ergotamine:None Current anti-emetic:None Current muscle relaxants:None Current anti-anxiolytic:None Current sleep aide:None Current Antihypertensive medications:Lisinopril Current Antidepressant medications:Nortriptyline20 mg at bedtime Current Anticonvulsant medications:topiramate 18m at bedtime; gabapentin 300 mg daily Current anti-CGRP:none Vitamins/Herbal/Supplements:Magnesium citrate 400 mg daily, riboflavin 400 mg, co-Q10 100 mg 3 times daily Current Antihistamines/Decongestants:None Other therapy:None   Caffeine:No Diet:Hydrates Exercise: He is more active at work. Depression:No; Anxiety:Yes, only because of headaches Other pain:History of chronic neck and back pain.  Sleep hygiene:Poor. Trouble falling asleep and staying asleep, often due to ongoing generalized aches and pain.  HISTORY: Onset: Age [age]years old Location:Right temporal Quality:Throbbing/pressure Initial intensity:8/10.Hedenies thunderclap headache or severe headache. He may wake up with headache (as they often occur at bedtime) but headaches themselves do not wake him up. Aura:no Prodrome:no Postdrome:no Associated symptoms: Nausea, photophobia. No vomiting, phonophobia or visual disturbance.Hedenies associated unilateral numbness or weakness. Initial  duration:30 minutes with Zomig 538m otherwise several hours InitialFrequency:Twice daily, always at night. InitialFrequency of abortive medication:5 to 7 days a week Triggers: Anxiety Relieving factors: Ice Activity:aggravates  Past NSAIDS: ibuprofen, Celebrex, naproxen 50010mast analgesics: tramadol, hydrocodone, tramadol Past abortive triptans:no Past muscle relaxants:no Past anti-emetic:no Past antihypertensive medications:no Past antidepressant medications: amitriptyline 71m41mook briefly for a few days to help with sleep) Past anticonvulsant medications:Topiramate 50 mg (cause nausea) Past vitamins/Herbal/Supplements:no Past antihistamines/decongestants:no Other past therapies:no  Family history of headache:No  PAST MEDICAL HISTORY: Past Medical History:  Diagnosis Date   Allergy    seasonal allergies per pt   Anemia    2012- turned down for donating blood, due to low count    Anxiety    claustrophobia    Arthritis    L knee , L shoudler    Bronchitis    Chronic pain    Diabetes mellitus    dx 2007   GERD (gastroesophageal reflux disease)    Headache    ?? 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      albuterol (PROVENTIL HFA;VENTOLIN HFA) 108 (90 Base) MCG/ACT inhaler Inhale 2 puffs every 6 (six) hours as needed into the lungs for wheezing or shortness of breath.      AMOXICILLIN PO Take by mouth.     ASPIRIN PO Take by mouth.     clindamycin (CLEOCIN T) 1 % external solution Apply 1 application daily as needed topically (for after shaving).     gabapentin  (NEURONTIN) 300 MG capsule Take 300 mg by mouth at bedtime.     latanoprost (XALATAN) 0.005 % ophthalmic solution Place 1 drop  into both eyes at bedtime.     lisinopril (PRINIVIL,ZESTRIL) 40 MG tablet Take 40 mg by mouth at bedtime.     Loratadine (CLARITIN PO) Take by mouth.     Magnesium 400 MG TABS Take 400 mg by mouth daily. 30 tablet 5   metFORMIN (GLUCOPHAGE XR) 750 MG 24 hr tablet Take 750 mg by mouth 2 (two) times daily.     MOVIPREP 100 g SOLR Moviprep as directed, no substitutions 1 kit 0   nortriptyline (PAMELOR) 10 MG capsule Take 2 capsules (20 mg total) by mouth at bedtime. 60 capsule 5   oxymetazoline (AFRIN) 0.05 % nasal spray Place 1 spray 2 (two) times daily as needed into both nostrils for congestion.     pravastatin (PRAVACHOL) 10 MG tablet Take 10 mg by mouth at bedtime.     Riboflavin 400 MG TABS Take 1 tablet by mouth daily. 90 tablet 3   topiramate (TOPAMAX) 50 MG tablet Take 2 capsules at bedtime daily 30 tablet 0   ZOLMitriptan (ZOMIG PO) Take by mouth.     Current Facility-Administered Medications on File Prior to Visit  Medication Dose Route Frequency Provider Last Rate Last Admin   0.9 %  sodium chloride infusion  500 mL Intravenous Once Nandigam, Kavitha V, MD        ALLERGIES: No Known Allergies  FAMILY HISTORY: Family History  Problem Relation Age of Onset   Heart disease Father    Blindness Mother    Anesthesia problems Neg Hx    Hypotension Neg Hx    Malignant hyperthermia Neg Hx    Pseudochol deficiency Neg Hx    Colon cancer Neg Hx    Colon polyps Neg Hx    Esophageal cancer Neg Hx    Rectal cancer Neg Hx    Stomach cancer Neg Hx    SOCIAL HISTORY: Social History   Socioeconomic History   Marital status: Divorced    Spouse name: Not on file   Number of children: 2   Years of education: Not on file   Highest education level: Some college, no degree  Occupational History   Occupation: retired    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 He takes care of his mother, she lives with him in a one story house. He is currently in P/T 3 x week. He drinks tea 3 x week.      07/16/18-Pt retired in January 2020   Social Determinants of Radio broadcast assistant Strain:    Difficulty of Paying Living Expenses:   Food Insecurity:    Worried About Charity fundraiser in the Last Year:    Arboriculturist in the Last Year:   Transportation Needs:    Film/video editor (Medical):    Lack of Transportation (Non-Medical):   Physical Activity:    Days of Exercise per Week:    Minutes of Exercise per Session:   Stress:    Feeling of Stress :  Social Connections:    Frequency of Communication with Friends and Family:    Frequency of Social Gatherings with Friends and Family:    Attends Religious Services:    Active Member of Clubs or Organizations:    Attends Music therapist:    Marital Status:   Intimate Partner Violence:    Fear of Current or Ex-Partner:    Emotionally Abused:    Physically Abused:    Sexually Abused:     PHYSICAL EXAM: Blood pressure 134/90, pulse 80, resp. rate 20, height _0  (1.88 m), weight 297 lb (134.7 kg), SpO2 96 %. General: No acute distress.  Patient appears well-groomed.   Head:  Normocephalic/atraumatic Eyes:  Fundi examined but not visualized Neck: supple, no paraspinal tenderness, full range of motion Heart:  Regular rate and rhythm Lungs:  Clear to auscultation bilaterally Back: No paraspinal tenderness Neurological Exam: alert  and oriented to person, place, and time. Attention span and concentration intact, recent and remote memory intact, fund of knowledge intact.  Speech fluent and not dysarthric, language intact.  CN II-XII intact. Bulk and tone normal, muscle strength 5/5 throughout.  Sensation to light touch, temperature and vibration intact.  Deep tendon reflexes 2+ throughout, toes downgoing.  Finger to nose and heel to shin testing intact.  Gait normal, Romberg negative.  IMPRESSION: 1.  Migraine without aura, without status migrainosus, not intractable 2.  Essential tremor  PLAN: 1.  For migraine prevention:  Nortriptyline 31m at bedtime; increase topiramate to 525mat bedtime 2. For treatment of tremor:  topiramate 5053mt bedtime 3.  For abortive therapy, Zomig 4.  Limit use of pain relievers to no more than 2 days out of week to prevent risk of rebound or medication-overuse headache. 5.  Keep headache diary 6.  Exercise, hydration, caffeine cessation, sleep hygiene, monitor for and avoid triggers 7. Follow up 6 months   Keith Calhoun

## 2019-12-06 IMAGING — CR DG CHEST 2V
2 series · 2 of 2 positions shown · non-contrast
Comparison: 09/12/2014

CLINICAL DATA: Back pain and chest pain

EXAM:
CHEST - 2 VIEW

[chest pa]
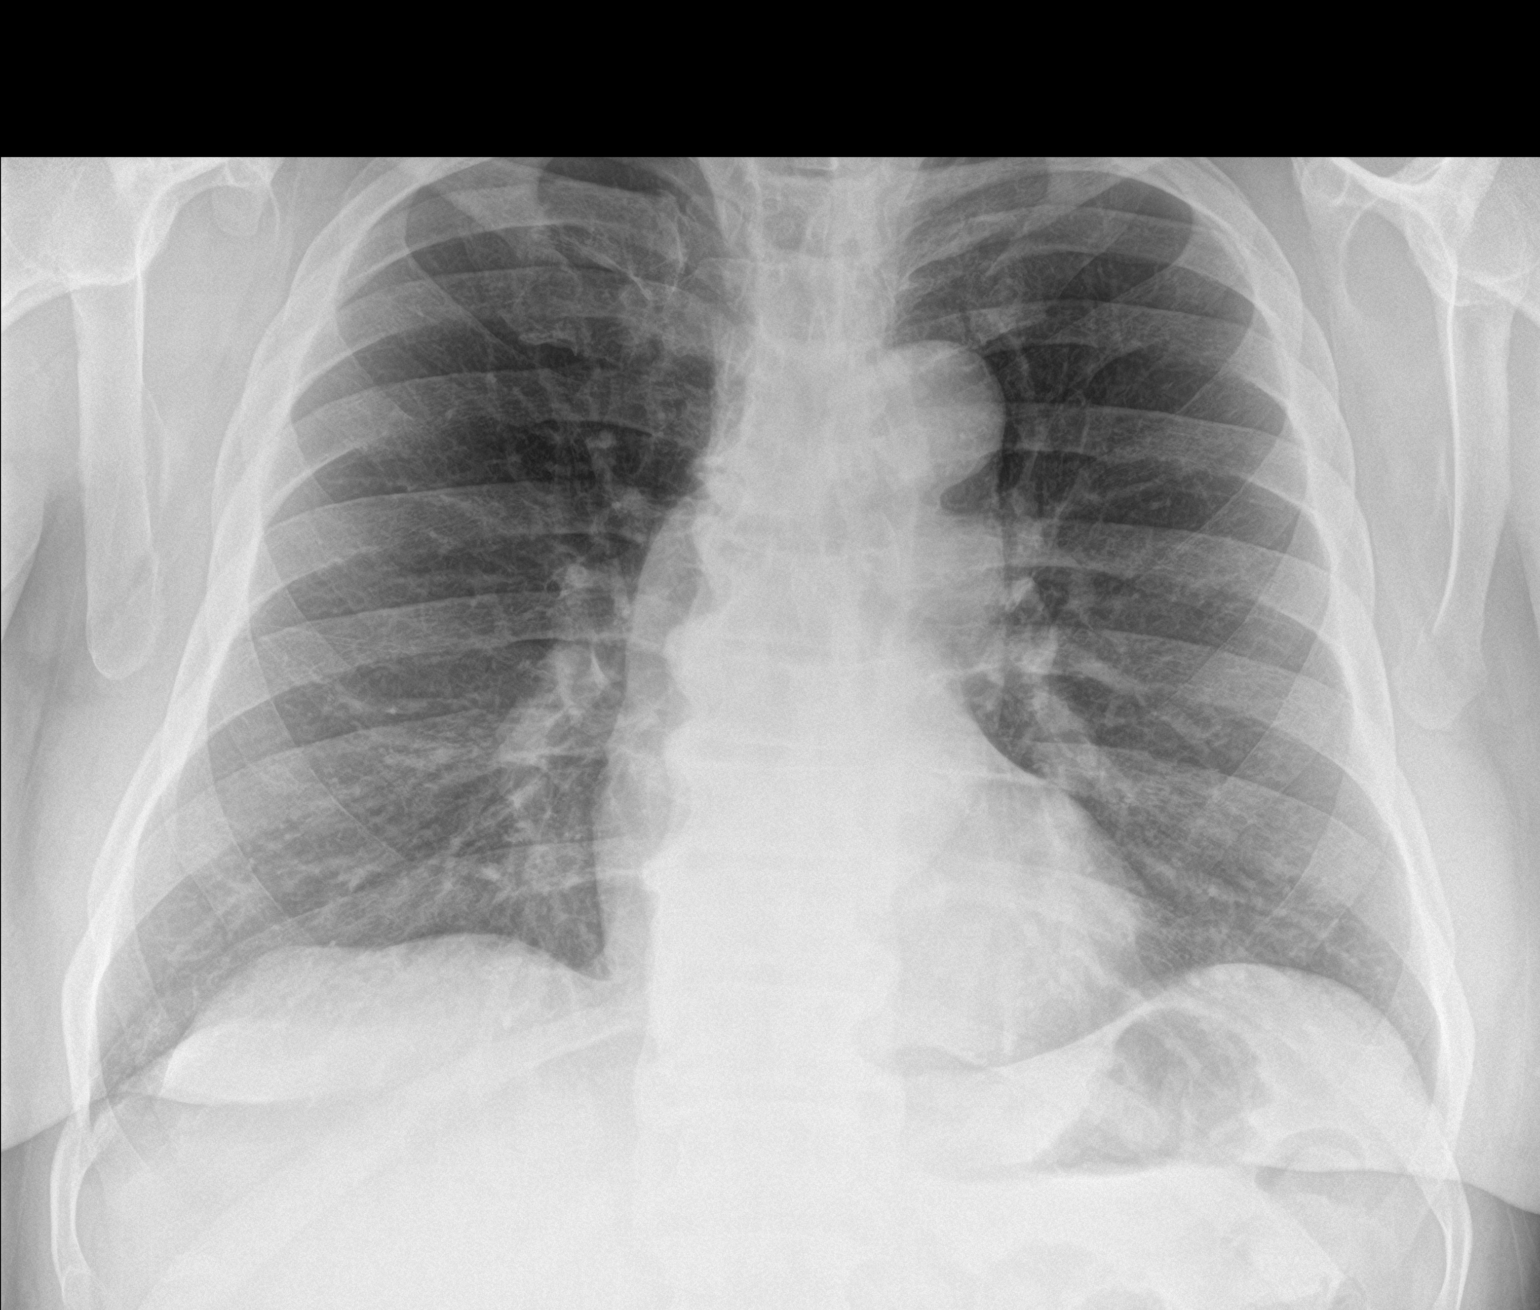

[chest lat]
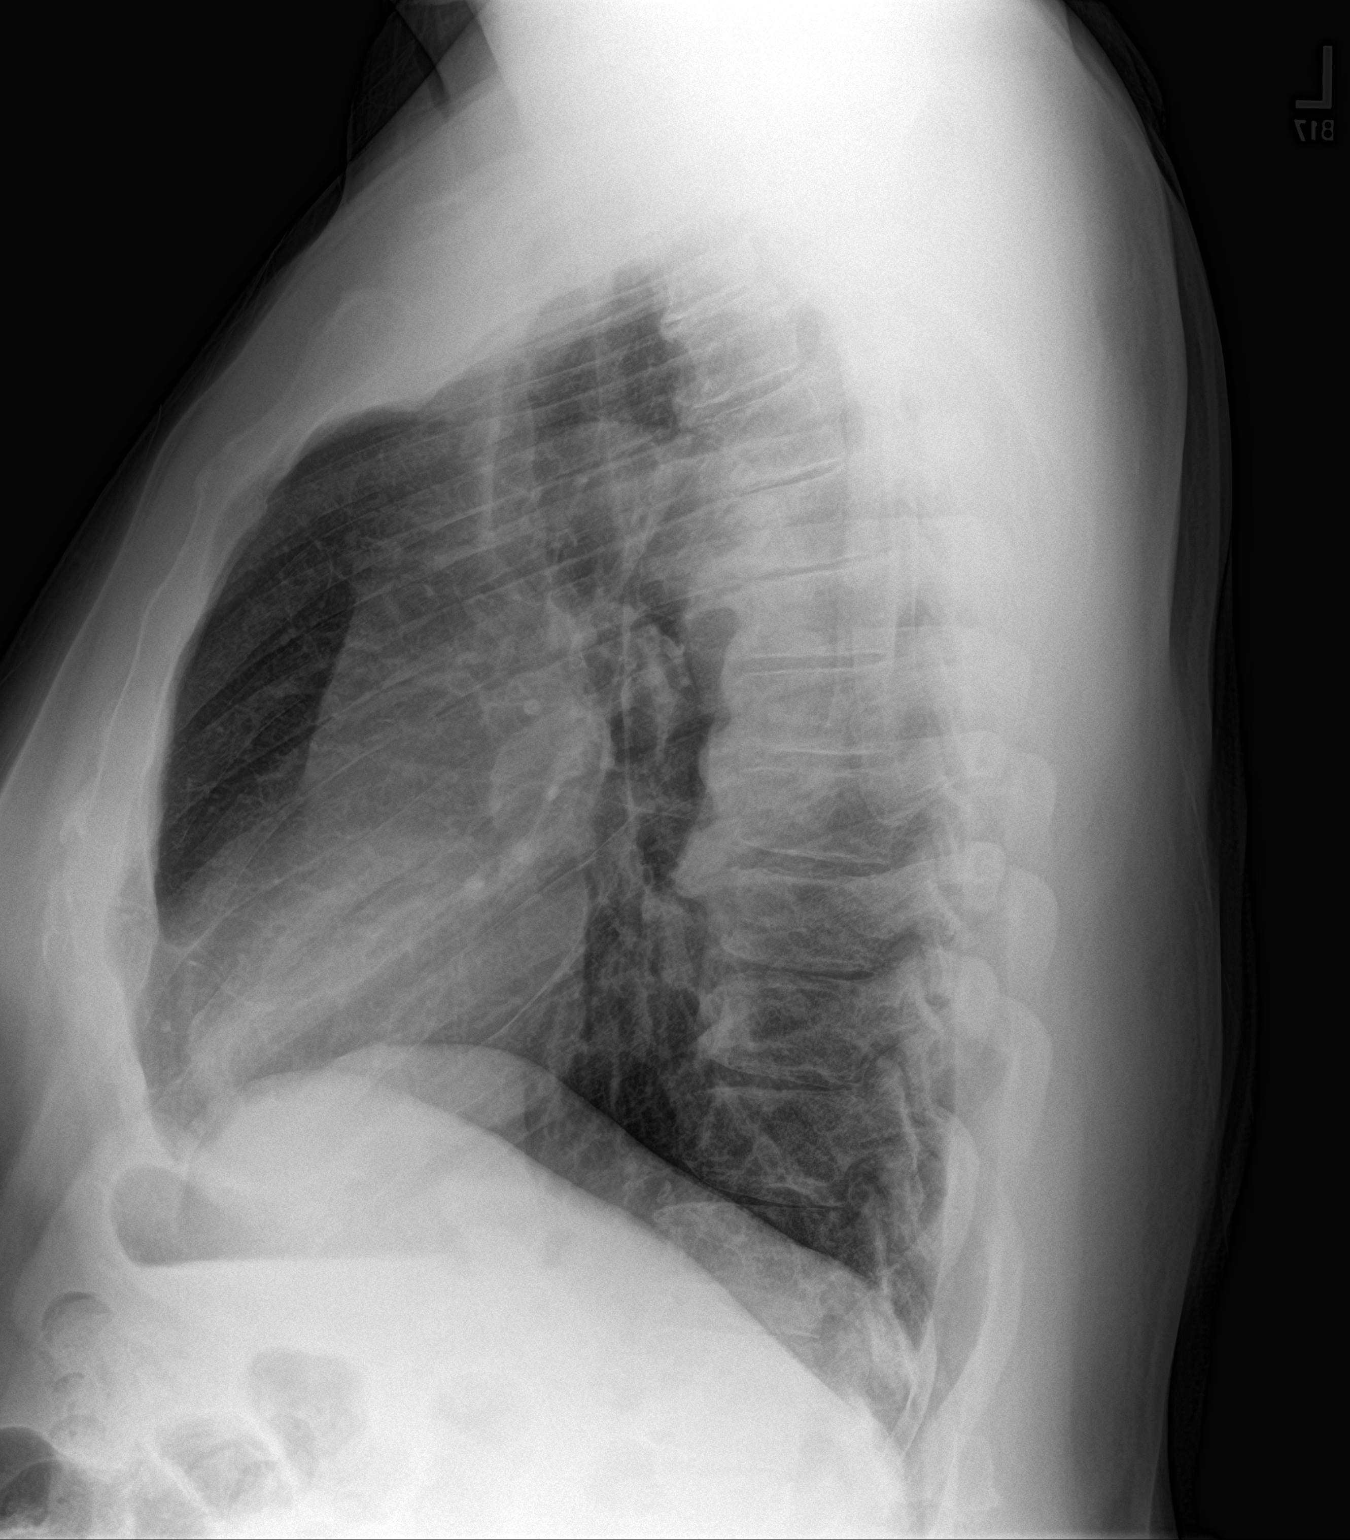

[2 of 2 positions shown; findings below may reference images not displayed]

FINDINGS: The heart size and mediastinal contours are within normal limits.
Both lungs are clear. Degenerative osteophytes of the spine.
IMPRESSION: No active cardiopulmonary disease.

## 2019-12-29 ENCOUNTER — Ambulatory Visit: Payer: Self-pay | Admitting: *Deleted

## 2019-12-29 NOTE — Telephone Encounter (Signed)
Patient is calling for new patient appointment- patient wants to have his breathing and chest discomfort evaluated. Patient states this is a long term problem and he goes to the [place] for care. Advised patient he needs to be seen within a 3 days period for this and he states he will follow up with the [place] for this. Patient still request a new patient appointment- patient has been scheduled.  Reason for Disposition  [1] Chest pain(s) lasting a few seconds from coughing AND [2] persists > 3 days  Answer Assessment - Initial Assessment Questions 1. LOCATION: "Where does it hurt?"       - not pain- feels cough and tightness in chest 2. RADIATION: "Does the pain go anywhere else?" (e.g., into neck, jaw, arms, back)     Cough- dry- patient thinks this is associated with breathing 3. ONSET: "When did the chest pain begin?" (Minutes, hours or days)      1 year 4. PATTERN "Does the pain come and go, or has it been constant since it started?"  "Does it get worse with exertion?"      Comes and goes 5. DURATION: "How long does it last" (e.g., seconds, minutes, hours)     seconds 6. SEVERITY: "How bad is the pain?"  (e.g., Scale 1-10; mild, moderate, or severe)    - MILD (1-3): doesn't interfere with normal activities     - MODERATE (4-7): interferes with normal activities or awakens from sleep    - SEVERE (8-10): excruciating pain, unable to do any normal activities       Laying on side causes it 7. CARDIAC RISK FACTORS: "Do you have any history of heart problems or risk factors for heart disease?" (e.g., angina, prior heart attack; diabetes, high blood pressure, high cholesterol, smoker, or strong family history of heart disease)     Diabetes, high blood pressure, history of smoking 8. PULMONARY RISK FACTORS: "Do you have any history of lung disease?"  (e.g., blood clots in lung, asthma, emphysema, birth control pills)     no 9. CAUSE: "What do you think is causing the chest pain?"     Patient thinks he  has breathing problem 10. OTHER SYMPTOMS: "Do you have any other symptoms?" (e.g., dizziness, nausea, vomiting, sweating, fever, difficulty breathing, cough)       Cough, breathing changes 11. PREGNANCY: "Is there any chance you are pregnant?" "When was your last menstrual period?"       n/a  Protocols used: CHEST PAIN-A-AH

## 2020-01-26 ENCOUNTER — Ambulatory Visit: Payer: No Typology Code available for payment source | Admitting: Neurology

## 2020-03-06 NOTE — Progress Notes (Signed)
Patient not seen.  He did not respond to multiple phone calls or the text message with link to start video visit.

## 2020-03-07 ENCOUNTER — Telehealth (INDEPENDENT_AMBULATORY_CARE_PROVIDER_SITE_OTHER): Payer: No Typology Code available for payment source | Admitting: Neurology

## 2020-03-07 ENCOUNTER — Other Ambulatory Visit: Payer: Self-pay

## 2020-03-07 DIAGNOSIS — G43009 Migraine without aura, not intractable, without status migrainosus: Secondary | ICD-10-CM

## 2020-03-17 ENCOUNTER — Encounter: Payer: Self-pay | Admitting: Internal Medicine

## 2020-03-17 ENCOUNTER — Ambulatory Visit: Payer: No Typology Code available for payment source | Attending: Internal Medicine | Admitting: Internal Medicine

## 2020-03-17 ENCOUNTER — Other Ambulatory Visit: Payer: Self-pay

## 2020-03-17 VITALS — BP 120/84 | HR 77 | Temp 99.1°F | Resp 16 | Ht 74.0 in | Wt 302.8 lb

## 2020-03-17 DIAGNOSIS — E669 Obesity, unspecified: Secondary | ICD-10-CM | POA: Diagnosis not present

## 2020-03-17 DIAGNOSIS — Z23 Encounter for immunization: Secondary | ICD-10-CM | POA: Diagnosis not present

## 2020-03-17 DIAGNOSIS — Z7689 Persons encountering health services in other specified circumstances: Secondary | ICD-10-CM

## 2020-03-17 DIAGNOSIS — E1169 Type 2 diabetes mellitus with other specified complication: Secondary | ICD-10-CM | POA: Diagnosis not present

## 2020-03-17 DIAGNOSIS — I10 Essential (primary) hypertension: Secondary | ICD-10-CM | POA: Diagnosis not present

## 2020-03-17 DIAGNOSIS — Z6838 Body mass index (BMI) 38.0-38.9, adult: Secondary | ICD-10-CM

## 2020-03-17 DIAGNOSIS — G252 Other specified forms of tremor: Secondary | ICD-10-CM | POA: Insufficient documentation

## 2020-03-17 DIAGNOSIS — Z125 Encounter for screening for malignant neoplasm of prostate: Secondary | ICD-10-CM

## 2020-03-17 DIAGNOSIS — E785 Hyperlipidemia, unspecified: Secondary | ICD-10-CM

## 2020-03-17 LAB — GLUCOSE, POCT (MANUAL RESULT ENTRY): POC Glucose: 244 mg/dl — AB (ref 70–99)

## 2020-03-17 LAB — POCT GLYCOSYLATED HEMOGLOBIN (HGB A1C): HbA1c, POC (controlled diabetic range): 10.7 % — AB (ref 0.0–7.0)

## 2020-03-17 NOTE — Patient Instructions (Addendum)
Try to check your blood sugars at least daily before breakfast.  The goal is to be 90-130 for blood sugars before meals.  We have referred you to medical weight management program.    Diabetes Mellitus and Nutrition, Adult When you have diabetes (diabetes mellitus), it is very important to have healthy eating habits because your blood sugar (glucose) levels are greatly affected by what you eat and drink. Eating healthy foods in the appropriate amounts, at about the same times every day, can help you:  Control your blood glucose.  Lower your risk of heart disease.  Improve your blood pressure.  Reach or maintain a healthy weight. Every person with diabetes is different, and each person has different needs for a meal plan. Your health care provider may recommend that you work with a diet and nutrition specialist (dietitian) to make a meal plan that is best for you. Your meal plan may vary depending on factors such as:  The calories you need.  The medicines you take.  Your weight.  Your blood glucose, blood pressure, and cholesterol levels.  Your activity level.  Other health conditions you have, such as heart or kidney disease. How do carbohydrates affect me? Carbohydrates, also called carbs, affect your blood glucose level more than any other type of food. Eating carbs naturally raises the amount of glucose in your blood. Carb counting is a method for keeping track of how many carbs you eat. Counting carbs is important to keep your blood glucose at a healthy level, especially if you use insulin or take certain oral diabetes medicines. It is important to know how many carbs you can safely have in each meal. This is different for every person. Your dietitian can help you calculate how many carbs you should have at each meal and for each snack. Foods that contain carbs include:  Bread, cereal, rice, pasta, and crackers.  Potatoes and corn.  Peas, beans, and lentils.  Milk and  yogurt.  Fruit and juice.  Desserts, such as cakes, cookies, ice cream, and candy. How does alcohol affect me? Alcohol can cause a sudden decrease in blood glucose (hypoglycemia), especially if you use insulin or take certain oral diabetes medicines. Hypoglycemia can be a life-threatening condition. Symptoms of hypoglycemia (sleepiness, dizziness, and confusion) are similar to symptoms of having too much alcohol. If your health care provider says that alcohol is safe for you, follow these guidelines:  Limit alcohol intake to no more than 1 drink per day for nonpregnant women and 2 drinks per day for men. One drink equals 12 oz of beer, 5 oz of wine, or 1 oz of hard liquor.  Do not drink on an empty stomach.  Keep yourself hydrated with water, diet soda, or unsweetened iced tea.  Keep in mind that regular soda, juice, and other mixers may contain a lot of sugar and must be counted as carbs. What are tips for following this plan?  Reading food labels  Start by checking the serving size on the "Nutrition Facts" label of packaged foods and drinks. The amount of calories, carbs, fats, and other nutrients listed on the label is based on one serving of the item. Many items contain more than one serving per package.  Check the total grams (g) of carbs in one serving. You can calculate the number of servings of carbs in one serving by dividing the total carbs by 15. For example, if a food has 30 g of total carbs, it would be equal  to 2 servings of carbs.  Check the number of grams (g) of saturated and trans fats in one serving. Choose foods that have low or no amount of these fats.  Check the number of milligrams (mg) of salt (sodium) in one serving. Most people should limit total sodium intake to less than 2,300 mg per day.  Always check the nutrition information of foods labeled as "low-fat" or "nonfat". These foods may be higher in added sugar or refined carbs and should be avoided.  Talk to  your dietitian to identify your daily goals for nutrients listed on the label. Shopping  Avoid buying canned, premade, or processed foods. These foods tend to be high in fat, sodium, and added sugar.  Shop around the outside edge of the grocery store. This includes fresh fruits and vegetables, bulk grains, fresh meats, and fresh dairy. Cooking  Use low-heat cooking methods, such as baking, instead of high-heat cooking methods like deep frying.  Cook using healthy oils, such as olive, canola, or sunflower oil.  Avoid cooking with butter, cream, or high-fat meats. Meal planning  Eat meals and snacks regularly, preferably at the same times every day. Avoid going long periods of time without eating.  Eat foods high in fiber, such as fresh fruits, vegetables, beans, and whole grains. Talk to your dietitian about how many servings of carbs you can eat at each meal.  Eat 4-6 ounces (oz) of lean protein each day, such as lean meat, chicken, fish, eggs, or tofu. One oz of lean protein is equal to: ? 1 oz of meat, chicken, or fish. ? 1 egg. ?  cup of tofu.  Eat some foods each day that contain healthy fats, such as avocado, nuts, seeds, and fish. Lifestyle  Check your blood glucose regularly.  Exercise regularly as told by your health care provider. This may include: ? 150 minutes of moderate-intensity or vigorous-intensity exercise each week. This could be brisk walking, biking, or water aerobics. ? Stretching and doing strength exercises, such as yoga or weightlifting, at least 2 times a week.  Take medicines as told by your health care provider.  Do not use any products that contain nicotine or tobacco, such as cigarettes and e-cigarettes. If you need help quitting, ask your health care provider.  Work with a Veterinary surgeon or diabetes educator to identify strategies to manage stress and any emotional and social challenges. Questions to ask a health care provider  Do I need to meet with  a diabetes educator?  Do I need to meet with a dietitian?  What number can I call if I have questions?  When are the best times to check my blood glucose? Where to find more information:  American Diabetes Association: diabetes.org  Academy of Nutrition and Dietetics: www.eatright.[employer] of Diabetes and Digestive and Kidney Diseases (NIH): CarFlippers.tn Summary  A healthy meal plan will help you control your blood glucose and maintain a healthy lifestyle.  Working with a diet and nutrition specialist (dietitian) can help you make a meal plan that is best for you.  Keep in mind that carbohydrates (carbs) and alcohol have immediate effects on your blood glucose levels. It is important to count carbs and to use alcohol carefully. This information is not intended to replace advice given to you by your health care provider. Make sure you discuss any questions you have with your health care provider. Document Revised: 02/08/2017 Document Reviewed: 04/02/2016 Elsevier Patient Education  2020 [employer].   Pneumococcal  Conjugate Vaccine (PCV13): What You Need to Know 1. Why get vaccinated? Pneumococcal conjugate vaccine (PCV13) can prevent pneumococcal disease. Pneumococcal disease refers to any illness caused by pneumococcal bacteria. These bacteria can cause many types of illnesses, including pneumonia, which is an infection of the lungs. Pneumococcal bacteria are one of the most common causes of pneumonia. Besides pneumonia, pneumococcal bacteria can also cause:  Ear infections  Sinus infections  Meningitis (infection of the tissue covering the brain and spinal cord)  Bacteremia (bloodstream infection) Anyone can get pneumococcal disease, but children under 59 years of age, people with certain medical conditions, adults 50 years or older, and cigarette smokers are at the highest risk. Most pneumococcal infections are mild. However, some can result in  long-term problems, such as brain damage or hearing loss. Meningitis, bacteremia, and pneumonia caused by pneumococcal disease can be fatal. 2. PCV13 PCV13 protects against 13 types of bacteria that cause pneumococcal disease. Infants and young children usually need 4 doses of pneumococcal conjugate vaccine, at 2, 4, 6, and 11-16 months of age. In some cases, a child might need fewer than 4 doses to complete PCV13 vaccination. A dose of PCV23 vaccine is also recommended for anyone 2 years or older with certain medical conditions if they did not already receive PCV13. This vaccine may be given to adults 85 years or older based on discussions between the patient and health care provider. 3. Talk with your health care provider Tell your vaccine provider if the person getting the vaccine:  Has had an allergic reaction after a previous dose of PCV13, to an earlier pneumococcal conjugate vaccine known as PCV7, or to any vaccine containing diphtheria toxoid (for example, DTaP), or has any severe, life-threatening allergies.  In some cases, your health care provider may decide to postpone PCV13 vaccination to a future visit. People with minor illnesses, such as a cold, may be vaccinated. People who are moderately or severely ill should usually wait until they recover before getting PCV13. Your health care provider can give you more information. 4. Risks of a vaccine reaction  Redness, swelling, pain, or tenderness where the shot is given, and fever, loss of appetite, fussiness (irritability), feeling tired, headache, and chills can happen after PCV13. Young children may be at increased risk for seizures caused by fever after PCV13 if it is administered at the same time as inactivated influenza vaccine. Ask your health care provider for more information. People sometimes faint after medical procedures, including vaccination. Tell your provider if you feel dizzy or have vision changes or ringing in the  ears. As with any medicine, there is a very remote chance of a vaccine causing a severe allergic reaction, other serious injury, or death. 5. What if there is a serious problem? An allergic reaction could occur after the vaccinated person leaves the clinic. If you see signs of a severe allergic reaction (hives, swelling of the face and throat, difficulty breathing, a fast heartbeat, dizziness, or weakness), call 9-1-1 and get the person to the nearest hospital. For other signs that concern you, call your health care provider. Adverse reactions should be reported to the Vaccine Adverse Event Reporting System (VAERS). Your health care provider will usually file this report, or you can do it yourself. Visit the VAERS website at www.vaers.SamedayNews.es or call 902 097 5020. VAERS is only for reporting reactions, and VAERS staff do not give medical advice. 6. The National Vaccine Injury Compensation Program The [employer] Vaccine Injury Compensation Program (VICP) is a [employer]  that was created to compensate people who may have been injured by certain vaccines. Visit the VICP website at SpiritualWord.at or call (320)166-5943 to learn about the program and about filing a claim. There is a time limit to file a claim for compensation. 7. How can I learn more?  Ask your health care provider.  Call your local or state health department.  Contact the Centers for Disease Control and Prevention (CDC): ? Call 970 674 6562 (1-800-CDC-INFO) or ? Visit CDC's website at PicCapture.uy Vaccine Information Statement PCV13 Vaccine (01/08/2018) This information is not intended to replace advice given to you by your health care provider. Make sure you discuss any questions you have with your health care provider. Document Revised: 06/17/2018 Document Reviewed: 10/08/2017 Elsevier Patient Education  2020 [employer].

## 2020-03-17 NOTE — Progress Notes (Signed)
Patient ID: [name], male    DOB: December 17, 1955  MRN: [id number]  CC: New Patient (Initial Visit), Diabetes, and Hypertension   Subjective: Keith Calhoun is a 65 y.o. male who presents for new pt visit His concerns today include:  Patient with history of DM type II, HL, HTN obesity, GERD, OA knee, migraine without aura followed by Dr. Tomi Likens,  Pt gets his care through the [place]. Primary is Dr. Adan Sis.  Seen every 6 mths Made this appt in October because he was having problems with breathing.  He had seen a physician at the [facility] for this and an x-ray was done.  He was told that the x-ray was okay.  He felt he needed further evaluation but symptoms have since resolved.  Now main concern is wgh and wants to get into a weight management program to help him lose weight.  He retired about a year ago.  Since then he states that he helps in taking care of his 23 year old mother.  They sit around eating snacks like cakes and drinking tea.  He has not been as active as he should be either during this time due to Covid and not wanting to bring the infection home to his mother.  Neither of them cook.  They eat out every day at places like [employer].  DIABETES TYPE 2/Obesity  Last A1C:   Results for orders placed or performed in visit on 03/17/20  CBC  Result Value Ref Range   WBC 6.4 3.4 - 10.8 x10E3/uL   RBC 4.98 4.14 - 5.80 x10E6/uL   Hemoglobin 13.1 13.0 - 17.7 g/dL   Hematocrit 40.7 37.5 - 51.0 %   MCV 82 79 - 97 fL   MCH 26.3 (L) 26.6 - 33.0 pg   MCHC 32.2 31.5 - 35.7 g/dL   RDW 15.2 11.6 - 15.4 %   Platelets 314 150 - 450 x10E3/uL  Comprehensive metabolic panel  Result Value Ref Range   Glucose 223 (H) 65 - 99 mg/dL   BUN 19 8 - 27 mg/dL   Creatinine, Ser 1.51 (H) 0.76 - 1.27 mg/dL   GFR calc non Af Amer 48 (L) >59 mL/min/1.73   GFR calc Af Amer 55 (L) >59 mL/min/1.73   BUN/Creatinine Ratio 13 10 - 24   Sodium 138 134 - 144 mmol/L   Potassium 5.0 3.5 - 5.2 mmol/L   Chloride  102 96 - 106 mmol/L   CO2 22 20 - 29 mmol/L   Calcium 9.0 8.6 - 10.2 mg/dL   Total Protein 7.2 6.0 - 8.5 g/dL   Albumin 4.2 3.8 - 4.8 g/dL   Globulin, Total 3.0 1.5 - 4.5 g/dL   Albumin/Globulin Ratio 1.4 1.2 - 2.2   Bilirubin Total 0.3 0.0 - 1.2 mg/dL   Alkaline Phosphatase 97 44 - 121 IU/L   AST 17 0 - 40 IU/L   ALT 18 0 - 44 IU/L  Lipid panel  Result Value Ref Range   Cholesterol, Total 227 (H) 100 - 199 mg/dL   Triglycerides 248 (H) 0 - 149 mg/dL   HDL 46 >39 mg/dL   VLDL Cholesterol Cal 44 (H) 5 - 40 mg/dL   LDL Chol Calc (NIH) 137 (H) 0 - 99 mg/dL   Chol/HDL Ratio 4.9 0.0 - 5.0 ratio  PSA  Result Value Ref Range   Prostate Specific Ag, Serum 0.3 0.0 - 4.0 ng/mL  POCT glucose (manual entry)  Result Value Ref Range  POC Glucose 244 (A) 70 - 99 mg/dl  POCT glycosylated hemoglobin (Hb A1C)  Result Value Ref Range   Hemoglobin A1C     HbA1c POC (<> result, manual entry)     HbA1c, POC (prediabetic range)     HbA1c, POC (controlled diabetic range) 10.7 (A) 0.0 - 7.0 %   Last A1C was 9 3 mths ago Med Adherence:  [x]  Yes  On Metformin 750 mg BID Medication side effects:  []  Yes    [x]  No Home Monitoring?  []  Yes    [x]  No but does have a device Home glucose results range: Diet Adherence: []  Yes    [x]  No - gained 23 lbs in last 4 mths.  Eating a lot since he retired 1 yr and not been very active.  He helps to care for mom so "we have been in a bubble" due to [place].  They eat out every day.   Exercise: []  Yes    [x]  No Hypoglycemic episodes?: []  Yes    [x]  No Numbness of the feet? []  Yes    []  No Retinopathy hx? []  Yes    []  No Last eye exam:  Comments:    HYPERTENSION Currently taking: see medication list.  He is on lisinopril 40 mg daily. Med Adherence: [x]  Yes    []  No Medication side effects: [x]  Yes    []  No Adherence with salt restriction: [x]  Yes    []  No Home Monitoring?: []  Yes    [x]  No Monitoring Frequency: []  Yes    []  No Home BP results range: []  Yes     []  No SOB? []  Yes    [x]  No Chest Pain?: []  Yes    [x]  No Leg swelling?: []  Yes    [x]  No Headaches?: []  Yes    [x]  No Dizziness? []  Yes    [x]  No Comments:   He complains of tremors in the hands with intention movements like holding a plate or when trying to feed himself with a fork.  Started 3 years ago.  He is followed by neurologist Dr. Tomi Likens for migraine headaches.  He is on nortriptyline and Topamax he states for the tremors.  Reports the tremors have improved since being on these medications.  However he would like to know what is causing the tremors.  No tremors at rest.  HL: He is on Pravachol and tolerating the medication.  HM: He has completed the Moderna vaccine series and booster, flu shot vaccine through the [place]. he thinks he has also completed the pneumonia vaccine.  He will call the [facility] to get the dates of these vaccines so that we can update his medical record.  Past medical, surgical, social and family history reviewed and updated  Patient Active Problem List   Diagnosis Date Noted   Class 2 severe obesity due to excess calories with serious comorbidity and body mass index (BMI) of 38.0 to 38.9 in adult [facility]) 03/17/2020   Essential hypertension 03/17/2020   Hyperlipidemia associated with type 2 diabetes mellitus ([place]) 03/17/2020   Intention tremor 03/17/2020   Sleep concern 09/02/2018   History of total right hip replacement 07/09/2017   Chronic midline low back pain without sciatica 06/29/2016   Muscle spasm 06/29/2016   Neck pain 06/29/2016   Right arm numbness 06/29/2016   Status post total left knee replacement 10/21/2014   Diabetes mellitus type II, controlled ([place]) 09/07/2014   Gastroesophageal reflux disease without esophagitis 09/07/2014   HLD (hyperlipidemia)  09/07/2014   Primary osteoarthritis of left knee 07/22/2014   Internal and external prolapsed hemorrhoids 03/15/2011     Current Outpatient Medications on File Prior to Visit   Medication Sig Dispense Refill   cetirizine (ZYRTEC) 10 MG chewable tablet Chew 10 mg by mouth daily.     Cholecalciferol 50 MCG (2000 UT) TBDP Take by mouth.     docusate sodium (COLACE) 50 MG capsule Take 50 mg by mouth 2 (two) times daily.     naproxen (NAPROSYN) 500 MG tablet Take 500 mg by mouth daily as needed.     acetaminophen (TYLENOL) 500 MG tablet Take 1,000 mg by mouth every 6 (six) hours as needed for mild pain or headache.      albuterol (PROVENTIL HFA;VENTOLIN HFA) 108 (90 Base) MCG/ACT inhaler Inhale 2 puffs every 6 (six) hours as needed into the lungs for wheezing or shortness of breath.      ASPIRIN PO Take by mouth.     gabapentin (NEURONTIN) 300 MG capsule Take 300 mg by mouth at bedtime.     latanoprost (XALATAN) 0.005 % ophthalmic solution Place 1 drop into both eyes at bedtime.     lisinopril (PRINIVIL,ZESTRIL) 40 MG tablet Take 40 mg by mouth at bedtime.     Magnesium 400 MG TABS Take 400 mg by mouth daily. 30 tablet 5   metFORMIN (GLUCOPHAGE XR) 750 MG 24 hr tablet Take 750 mg by mouth 2 (two) times daily.     MOVIPREP 100 g SOLR Moviprep as directed, no substitutions 1 kit 0   nortriptyline (PAMELOR) 10 MG capsule Take 2 capsules (20 mg total) by mouth at bedtime. 60 capsule 5   oxymetazoline (AFRIN) 0.05 % nasal spray Place 1 spray 2 (two) times daily as needed into both nostrils for congestion.     pravastatin (PRAVACHOL) 10 MG tablet Take 10 mg by mouth at bedtime.     Riboflavin 400 MG TABS Take 1 tablet by mouth daily. 90 tablet 3   topiramate (TOPAMAX) 100 MG tablet Take one capsule at bedtime 30 tablet 5   ZOLMitriptan (ZOMIG PO) Take by mouth.     Current Facility-Administered Medications on File Prior to Visit  Medication Dose Route Frequency Provider Last Rate Last Admin   0.9 %  sodium chloride infusion  500 mL Intravenous Once Nandigam, Venia Minks, MD        No Known Allergies  Social History   Socioeconomic History   Marital  status: Divorced    Spouse name: Not on file   Number of children: 2   Years of education: Not on file   Highest education level: Some college, no degree  Occupational History   Occupation: retired    Fish farm manager: [employer]    Comment: FMLA  leave  Tobacco Use   Smoking status: Former Smoker    Types: Cigarettes    Quit date: 03/13/2007    Years since quitting: 13.0   Smokeless tobacco: Never Used  Scientific laboratory technician Use: Never used  Substance and Sexual Activity   Alcohol use: Not Currently   Drug use: No   Sexual activity: Not on file  Other Topics Concern   Not on file  Social History Narrative   Patient is right-handed. He takes care of his mother, she lives with him in a one story house. He is currently in P/T 3 x week. He drinks tea 3 x week.      07/16/18-Pt retired in January 2020  Social Determinants of Health   Financial Resource Strain: Not on file  Food Insecurity: Not on file  Transportation Needs: Not on file  Physical Activity: Not on file  Stress: Not on file  Social Connections: Not on file  Intimate Partner Violence: Not on file    Family History  Problem Relation Age of Onset   Heart disease Father    Blindness Mother    Anesthesia problems Neg Hx    Hypotension Neg Hx    Malignant hyperthermia Neg Hx    Pseudochol deficiency Neg Hx    Colon cancer Neg Hx    Colon polyps Neg Hx    Esophageal cancer Neg Hx    Rectal cancer Neg Hx    Stomach cancer Neg Hx     Past Surgical History:  Procedure Laterality Date   COLONOSCOPY     5-10 yrs ago per pt   HEMORRHOID SURGERY  04/2011   HEMORRHOID SURGERY  05/09/2011   Procedure: HEMORRHOIDECTOMY;  Surgeon: Imogene Burn. Georgette Dover, MD;  Location: [place];  Service: General;  Laterality: N/A;   JOINT REPLACEMENT     knee (L) and hip (R)   KNEE SURGERY  1978   left   MULTIPLE TOOTH EXTRACTIONS     RADIOLOGY WITH ANESTHESIA N/A 04/24/2016   Procedure: MRI - CERVICAL SPINE WITHOUT CONTRAST  AND LUMBAR SPINE WITHOUT CONTRAST;  Surgeon: Medication Radiologist, MD;  Location: [place];  Service: Radiology;  Laterality: N/A;   RADIOLOGY WITH ANESTHESIA Right 01/24/2017   Procedure: MRI RIGHT HIP WITHOUT;  Surgeon: Radiologist, Medication, MD;  Location: [place];  Service: Radiology;  Laterality: Right;   RADIOLOGY WITH ANESTHESIA N/A 01/14/2018   Procedure: MRI WITH ANESTHESIA OF CERVICAL SPINE WITHOUT CONTRAST;  Surgeon: Radiologist, Medication, MD;  Location: [place];  Service: Radiology;  Laterality: N/A;   SHOULDER ARTHROSCOPY DISTAL CLAVICLE EXCISION AND OPEN ROTATOR CUFF REPAIR     L shoulder    TONSILLECTOMY     as a child    ROS: Review of Systems Negative except as stated above  PHYSICAL EXAM: BP 120/84    Pulse 77    Temp 99.1 F (37.3 C)    Resp 16    Ht 6' 2" (1.88 m)    Wt (!) 302 lb 12.8 oz (137.3 kg)    SpO2 97%    BMI 38.88 kg/m   Physical Exam   General appearance - alert, well appearing, obese older [employer] and in no distress Mental status - normal mood, behavior, speech, dress, motor activity, and thought processes Eyes - pupils equal and reactive, extraocular eye movements intact Mouth - mucous membranes moist, pharynx normal without lesions Neck - supple, no significant adenopathy Chest - clear to auscultation, no wheezes, rales or rhonchi, symmetric air entry Heart - normal rate, regular rhythm, normal S1, S2, no murmurs, rubs, clicks or gallops Extremities -lower extremity edema Neurologic: Grip 5/5 bilaterally.  Patient has fine tremor of both hands when asked to hold a book.  He also has mild intention tremor when asked to point and touch my index finger. CMP Latest Ref Rng & Units 03/17/2020 01/14/2018 11/28/2017  Glucose 65 - 99 mg/dL 223(H) 203(H) 111(H)  BUN 8 - 27 mg/dL _0 Creatinine 0.76 - 1.27 mg/dL 1.51(H) 1.16 1.17  Sodium 134 - 144 mmol/L 138 135 141  Potassium 3.5 - 5.2 mmol/L 5.0 4.6 4.6  Chloride 96 - 106 mmol/L 102 101 103  CO2 20 - 29  mmol/L _0 Calcium 8.6 - 10.2 mg/dL 9.0 8.5(L) 9.2  Total Protein 6.0 - 8.5 g/dL 7.2 - -  Total Bilirubin 0.0 - 1.2 mg/dL 0.3 - -  Alkaline Phos 44 - 121 IU/L 97 - -  AST 0 - 40 IU/L 17 - -  ALT 0 - 44 IU/L 18 - -   Lipid Panel     Component Value Date/Time   CHOL 227 (H) 03/17/2020 1503   TRIG 248 (H) 03/17/2020 1503   HDL 46 03/17/2020 1503   CHOLHDL 4.9 03/17/2020 1503   LDLCALC 137 (H) 03/17/2020 1503    CBC    Component Value Date/Time   WBC 6.4 03/17/2020 1503   WBC 9.5 11/28/2017 2028   RBC 4.98 03/17/2020 1503   RBC 4.57 11/28/2017 2028   HGB 13.1 03/17/2020 1503   HCT 40.7 03/17/2020 1503   PLT 314 03/17/2020 1503   MCV 82 03/17/2020 1503   MCH 26.3 (L) 03/17/2020 1503   MCH 26.0 11/28/2017 2028   MCHC 32.2 03/17/2020 1503   MCHC 31.6 11/28/2017 2028   RDW 15.2 03/17/2020 1503   LYMPHSABS 2.8 11/28/2017 2028   MONOABS 0.7 11/28/2017 2028   EOSABS 0.1 11/28/2017 2028   BASOSABS 0.0 11/28/2017 2028    ASSESSMENT AND PLAN: 1. Encounter to establish care Patient will call the [facility] and get his immunization records so that we can update his record.  He is up-to-date with colon cancer screening.  2. Type 2 diabetes mellitus with obesity ([place]) Discussed and encourage healthy eating habits.  Advised to avoid sugary drinks, cut back on portion sizes of white carbohydrates, eat more lean white meat like chicken [place] and seafood instead of red meat or pork.  Encouraged to incorporate fresh fruits and vegetables into the diet. Encourage him to get moving.  Advised to start low and go slow with some form of moderate intensity exercise like brisk walking or riding a bike.  For starters if he can only do 10 minutes 3 times a week I told him to start there and gradually increase to 30 minutes 3 times a week. Given his A1c, I recommend that he continues Metformin but we add Lantus or Trulicity.  The risks and benefits of Trulicity explained to him.  I think it will help  with weight loss.  Also given the option of Lantus at bedtime.  Patient not interested in Lantus or Trulicity stating that he does not want to stick himself.  He understands that adding another oral medication may not get his A1c at goal.  However he still prefers oral medication added instead of Lantus or Trulicity.  I will add low-dose of Amaryl.  He also requests referral to an endocrinologist which I have submitted. - POCT glucose (manual entry) - POCT glycosylated hemoglobin (Hb A1C) - CBC - Comprehensive metabolic panel - Lipid panel  3. Class 2 severe obesity due to excess calories with serious comorbidity and body mass index (BMI) of 38.0 to 38.9 in adult [place]) See #2 above. Patient agreeable to referral to medical weight management.  He is motivated to lose weight. - Amb Ref to Medical Weight Management  4. Essential hypertension At goal.  Continue current medication and low-salt diet  5. Hyperlipidemia associated with type 2 diabetes mellitus (H[facility]) Continue Pravachol.  Check lipid profile today.  6. Intention tremor Told patient that it seems he has intention tremors.  Discussed that diagnosis with him.  Advised to discuss further with  his neurologist.  7. Prostate cancer screening Recommend prostate cancer screening with a PSA.  Patient agreeable to screening. - PSA    Patient was given the opportunity to ask questions.  Patient verbalized understanding of the plan and was able to repeat key elements of the plan.   Orders Placed This Encounter  Procedures   Pneumococcal conjugate vaccine 13-valent IM   CBC   Comprehensive metabolic panel   Lipid panel   PSA   Amb Ref to Medical Weight Management   Ambulatory referral to Endocrinology   POCT glucose (manual entry)   POCT glycosylated hemoglobin (Hb A1C)     Requested Prescriptions   Signed Prescriptions Disp Refills   glimepiride (AMARYL) 2 MG tablet 30 tablet 3    Sig: Take 1 tablet (2 mg total) by  mouth daily with breakfast.    Return in about 4 months (around 07/15/2020).  Karle Plumber, MD, FACP

## 2020-03-18 LAB — CBC
Hematocrit: 40.7 % (ref 37.5–51.0)
Hemoglobin: 13.1 g/dL (ref 13.0–17.7)
MCH: 26.3 pg — ABNORMAL LOW (ref 26.6–33.0)
MCHC: 32.2 g/dL (ref 31.5–35.7)
MCV: 82 fL (ref 79–97)
Platelets: 314 10*3/uL (ref 150–450)
RBC: 4.98 x10E6/uL (ref 4.14–5.80)
RDW: 15.2 % (ref 11.6–15.4)
WBC: 6.4 10*3/uL (ref 3.4–10.8)

## 2020-03-18 LAB — COMPREHENSIVE METABOLIC PANEL
ALT: 18 IU/L (ref 0–44)
AST: 17 IU/L (ref 0–40)
Albumin/Globulin Ratio: 1.4 (ref 1.2–2.2)
Albumin: 4.2 g/dL (ref 3.8–4.8)
Alkaline Phosphatase: 97 IU/L (ref 44–121)
BUN/Creatinine Ratio: 13 (ref 10–24)
BUN: 19 mg/dL (ref 8–27)
Bilirubin Total: 0.3 mg/dL (ref 0.0–1.2)
CO2: 22 mmol/L (ref 20–29)
Calcium: 9 mg/dL (ref 8.6–10.2)
Chloride: 102 mmol/L (ref 96–106)
Creatinine, Ser: 1.51 mg/dL — ABNORMAL HIGH (ref 0.76–1.27)
GFR calc Af Amer: 55 mL/min/{1.73_m2} — ABNORMAL LOW (ref >59)
GFR calc non Af Amer: 48 mL/min/{1.73_m2} — ABNORMAL LOW (ref >59)
Globulin, Total: 3 g/dL (ref 1.5–4.5)
Glucose: 223 mg/dL — ABNORMAL HIGH (ref 65–99)
Potassium: 5 mmol/L (ref 3.5–5.2)
Sodium: 138 mmol/L (ref 134–144)
Total Protein: 7.2 g/dL (ref 6.0–8.5)

## 2020-03-18 LAB — LIPID PANEL
Chol/HDL Ratio: 4.9 ratio (ref 0.0–5.0)
Cholesterol, Total: 227 mg/dL — ABNORMAL HIGH (ref 100–199)
HDL: 46 mg/dL (ref >39)
LDL Chol Calc (NIH): 137 mg/dL — ABNORMAL HIGH (ref 0–99)
Triglycerides: 248 mg/dL — ABNORMAL HIGH (ref 0–149)
VLDL Cholesterol Cal: 44 mg/dL — ABNORMAL HIGH (ref 5–40)

## 2020-03-18 LAB — PSA: Prostate Specific Ag, Serum: 0.3 ng/mL (ref 0.0–4.0)

## 2020-03-18 MED ORDER — GLIMEPIRIDE 2 MG PO TABS: 2.0000 mg | ORAL_TABLET | Freq: Every day | ORAL | 3 refills | Status: DC

## 2020-03-19 NOTE — Progress Notes (Signed)
Let patient know that his blood cell counts including red blood cell, white blood cell and platelet counts are normal. Kidney function is not 100%.  This is something we will have to monitor over time which means we will recheck function again in about 3 to 4 months.  He should avoid taking over-the-counter pain medications like ibuprofen, Aleve, Advil, Naprosyn and ibuprofen as these can worsen kidney function.  Liver function tests normal.  LDL cholesterol is 137 with goal being less than 70.  Please acquire whether he has been taking the Pravachol 10 mg consistently.  If he has been taking it consistently then we need to increase the dose to 20 mg daily please let me know if we need to increase the dose and which pharmacy to send the increased dose to.  PSA level which is the screening test for prostate cancer was normal.

## 2020-03-21 ENCOUNTER — Other Ambulatory Visit: Payer: Self-pay

## 2020-03-21 ENCOUNTER — Ambulatory Visit (INDEPENDENT_AMBULATORY_CARE_PROVIDER_SITE_OTHER): Payer: No Typology Code available for payment source | Admitting: Endocrinology

## 2020-03-21 ENCOUNTER — Encounter: Payer: Self-pay | Admitting: Endocrinology

## 2020-03-21 VITALS — BP 142/92 | HR 93 | Ht 74.0 in | Wt 300.0 lb

## 2020-03-21 DIAGNOSIS — E1169 Type 2 diabetes mellitus with other specified complication: Secondary | ICD-10-CM

## 2020-03-21 DIAGNOSIS — E669 Obesity, unspecified: Secondary | ICD-10-CM

## 2020-03-21 LAB — POCT GLYCOSYLATED HEMOGLOBIN (HGB A1C): Hemoglobin A1C: 10.9 % — AB (ref 4.0–5.6)

## 2020-03-21 MED ORDER — REPAGLINIDE 2 MG PO TABS: 2.0000 mg | ORAL_TABLET | Freq: Two times a day (BID) | ORAL | 3 refills | Status: DC

## 2020-03-21 MED ORDER — ACCU-CHEK GUIDE VI STRP: 1.0000 | ORAL_STRIP | Freq: Every day | 3 refills | Status: DC

## 2020-03-21 NOTE — Patient Instructions (Addendum)
Your blood pressure is high today.  Please see your primary care provider soon, to have it rechecked good diet and exercise significantly improve the control of your diabetes.  please let me know if you wish to be referred to a dietician.  high blood sugar is very risky to your health.  you should see an eye doctor and dentist every year.  It is very important to get all recommended vaccinations.  Controlling your blood pressure and cholesterol drastically reduces the damage diabetes does to your body.  Those who smoke should quit.  Please discuss these with your doctor.  check your blood sugar once a day.  vary the time of day when you check, between before the 3 meals, and at bedtime.  also check if you have symptoms of your blood sugar being too high or too low.  please keep a record of the readings and bring it to your next appointment here (or you can bring the meter itself).  You can write it on any piece of paper.  please call us sooner if your blood sugar goes below 70, or if you have a lot of readings over 200.   Here is a new meter.  I have sent a prescription to your pharmacy, for strips.   I have also sent a prescription to your pharmacy, to change the glimepiride to repaglinide.  Please continue the same metformin for now, but we'll need to phase it out, due to your kidneys.   Please come back for a follow-up appointment in 1 month.

## 2020-03-21 NOTE — Progress Notes (Signed)
Subjective:    Patient ID: Keith Calhoun, male    DOB: [birth year], 65 y.o.   MRN: [id number]  HPI pt is referred by Dr Wynetta Emery, for diabetes.  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  He takes 2 oral meds.  He has back pain, but no recent steroids injections.  He also has fatigue.  He takes metformin, and glimepiride was added last week.  He does not check cbg's.  Past Medical History:  Diagnosis Date  . Allergy    seasonal allergies per pt  . Anemia    2012- turned down for donating blood, due to low count   . Anxiety    claustrophobia   . Arthritis    L knee , L shoudler   . Bronchitis   . Chronic pain   . Diabetes mellitus    dx 2007  . GERD (gastroesophageal reflux disease)   . Headache    ?? stress   . Hemorrhoids   . Hyperlipidemia   . Hypertension   . Neck pain   . 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 Wears dentures   . Wears glasses     Past Surgical History:  Procedure Laterality Date  . COLONOSCOPY     5-10 yrs ago per pt  . HEMORRHOID SURGERY  04/2011  . HEMORRHOID SURGERY  05/09/2011   Procedure: HEMORRHOIDECTOMY;  Surgeon: Imogene Burn. Georgette Dover, MD;  Location: [facility];  Service: General;  Laterality: N/A;  . JOINT REPLACEMENT     knee (L) and hip (R)  . [place]   left  . MULTIPLE TOOTH EXTRACTIONS    . RADIOLOGY WITH ANESTHESIA N/A 04/24/2016   Procedure: MRI - CERVICAL SPINE WITHOUT CONTRAST AND LUMBAR SPINE WITHOUT CONTRAST;  Surgeon: Medication Radiologist, MD;  Location: [facility];  Service: Radiology;  Laterality: N/A;  . RADIOLOGY WITH ANESTHESIA Right 01/24/2017   Procedure: MRI RIGHT HIP WITHOUT;  Surgeon: Radiologist, Medication, MD;  Location: [facility];  Service: Radiology;  Laterality: Right;  . RADIOLOGY WITH ANESTHESIA N/A 01/14/2018   Procedure: MRI WITH  ANESTHESIA OF CERVICAL SPINE WITHOUT CONTRAST;  Surgeon: Radiologist, Medication, MD;  Location: [facility];  Service: Radiology;  Laterality: N/A;  . SHOULDER ARTHROSCOPY DISTAL CLAVICLE EXCISION AND OPEN ROTATOR CUFF REPAIR     L shoulder   . TONSILLECTOMY     as a child    Social History   Socioeconomic History  . Marital status: Divorced    Spouse name: Not on file  . Number of children: 2  . Years of education: Not on file  . Highest education level: Some college, no degree  Occupational History  . 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 Smoking status: Former Smoker    Types: Cigarettes    Quit date: 03/13/2007    Years since quitting: 13.0  . Smokeless tobacco: Never Used  Vaping Use  . Vaping Use: Never used  Substance and Sexual Activity  . Alcohol use: Not Currently  . Drug use: No  . Sexual activity: Not on file  Other Topics Concern  . Not on file  Social History Narrative   Patient is right-handed. He takes care of his mother, she  lives with him in a one story house. He is currently in P/T 3 x week. He drinks tea 3 x week.      07/16/18-Pt retired in January 2020   Social Determinants of Radio broadcast assistant Strain: Not on [employer] Insecurity: Not on file  Transportation Needs: Not on file  Physical Activity: Not on file  Stress: Not on file  Social Connections: Not on file  Intimate Partner Violence: Not on file    Current Outpatient Medications on File Prior to Visit  Medication Sig Dispense Refill  . acetaminophen (TYLENOL) 500 MG tablet Take 1,000 mg by mouth every 6 (six) hours as needed for mild pain or headache.     . albuterol (PROVENTIL HFA;VENTOLIN HFA) 108 (90 Base) MCG/ACT inhaler Inhale 2 puffs every 6 (six) hours as needed into the lungs for wheezing or shortness of breath.     . ASPIRIN PO Take by mouth.    . cetirizine (ZYRTEC) 10 MG chewable tablet Chew 10 mg by mouth daily.    . Cholecalciferol  50 MCG (2000 UT) TBDP Take by mouth.    . docusate sodium (COLACE) 50 MG capsule Take 50 mg by mouth 2 (two) times daily.    Marland Kitchen gabapentin (NEURONTIN) 300 MG capsule Take 300 mg by mouth at bedtime.    Marland Kitchen latanoprost (XALATAN) 0.005 % ophthalmic solution Place 1 drop into both eyes at bedtime.    Marland Kitchen lisinopril (PRINIVIL,ZESTRIL) 40 MG tablet Take 40 mg by mouth at bedtime.    . Magnesium 400 MG TABS Take 400 mg by mouth daily. 30 tablet 5  . metFORMIN (GLUCOPHAGE-XR) 750 MG 24 hr tablet Take 750 mg by mouth 2 (two) times daily.    Marland Kitchen MOVIPREP 100 g SOLR Moviprep as directed, no substitutions 1 kit 0  . naproxen (NAPROSYN) 500 MG tablet Take 500 mg by mouth daily as needed.    . nortriptyline (PAMELOR) 10 MG capsule Take 2 capsules (20 mg total) by mouth at bedtime. 60 capsule 5  . oxymetazoline (AFRIN) 0.05 % nasal spray Place 1 spray 2 (two) times daily as needed into both nostrils for congestion.    . pravastatin (PRAVACHOL) 10 MG tablet Take 10 mg by mouth at bedtime.    . Riboflavin 400 MG TABS Take 1 tablet by mouth daily. 90 tablet 3  . topiramate (TOPAMAX) 100 MG tablet Take one capsule at bedtime 30 tablet 5  . ZOLMitriptan (ZOMIG PO) Take by mouth.     Current Facility-Administered Medications on File Prior to Visit  Medication Dose Route Frequency Provider Last Rate Last Admin  . 0.9 %  sodium chloride infusion  500 mL Intravenous Once Nandigam, Venia Minks, MD        No Known Allergies  Family History  Problem Relation Age of Onset  . Heart disease Father   . Diabetes Father   . Blindness Mother   . Anesthesia problems Neg Hx   . Hypotension Neg Hx   . Malignant hyperthermia Neg Hx   . Pseudochol deficiency Neg Hx   . Colon cancer Neg Hx   . Colon polyps Neg Hx   . Esophageal cancer Neg Hx   . Rectal cancer Neg Hx   . Stomach cancer Neg Hx     BP (!) 142/92   Pulse 93   Ht _0  (1.88 m)   Wt 300 lb (136.1 kg)   SpO2 95%   BMI 38.52 kg/m   Review  of Systems denies  weight loss, blurry vision, chest pain, sob, n/v/d, urinary frequency, and depression.      Objective:   Physical Exam VITAL SIGNS:  See vs page.   GENERAL: no distress.  Pulses: dorsalis pedis intact bilat.   MSK: no deformity of the feet.   CV: no leg edema.   Skin:  no ulcer on the feet.  normal color and temp on the feet.   Neuro: sensation is intact to touch on the feet.    Lab Results  Component Value Date   HGBA1C 10.7 (A) 03/17/2020   Lab Results  Component Value Date   CREATININE 1.51 (H) 03/17/2020   BUN 19 03/17/2020   NA 138 03/17/2020   K 5.0 03/17/2020   CL 102 03/17/2020   CO2 22 03/17/2020   I have reviewed outside records, and summarized: Pt was noted to have elevated A1c, and referred here.  HTN, tremor, dyslipidemia, and wellness were also addressed.       Assessment & Plan:  Type 2 DM: uncontrolled.  Stage 3 CRI: he should phase our glimepiride and metformin when possible.   HTN: is noted today.     Patient Instructions  Your blood pressure is high today.  Please see your primary care provider soon, to have it rechecked good diet and exercise significantly improve the control of your diabetes.  please let me know if you wish to be referred to a dietician.  high blood sugar is very risky to your health.  you should see an eye doctor and dentist every year.  It is very important to get all recommended vaccinations.  Controlling your blood pressure and cholesterol drastically reduces the damage diabetes does to your body.  Those who smoke should quit.  Please discuss these with your doctor.  check your blood sugar once a day.  vary the time of day when you check, between before the 3 meals, and at bedtime.  also check if you have symptoms of your blood sugar being too high or too low.  please keep a record of the readings and bring it to your next appointment here (or you can bring the meter itself).  You can write it on any piece of paper.  please call us  sooner if your blood sugar goes below 70, or if you have a lot of readings over 200.   Here is a new meter.  I have sent a prescription to your pharmacy, for strips.   I have also sent a prescription to your pharmacy, to change the glimepiride to repaglinide.  Please continue the same metformin for now, but we'll need to phase it out, due to your kidneys.   Please come back for a follow-up appointment in 1 month.

## 2020-03-29 ENCOUNTER — Telehealth: Payer: Self-pay | Admitting: Neurology

## 2020-03-29 MED ORDER — NORTRIPTYLINE HCL 10 MG PO CAPS: 20.0000 mg | ORAL_CAPSULE | Freq: Every day | ORAL | 0 refills | Status: DC

## 2020-03-29 MED ORDER — TOPIRAMATE 100 MG PO TABS: ORAL_TABLET | ORAL | 0 refills | Status: DC

## 2020-03-29 NOTE — Telephone Encounter (Signed)
Patient is requesting to pick up two new prescriptions for: 100mg  topiramate and 10mg  nortriptyline.  Patient is completely out of the medication at this time.  [facility] in [place]

## 2020-03-29 NOTE — Telephone Encounter (Signed)
Per Pt refill sent to [facility] pharmacy.

## 2020-04-11 NOTE — Progress Notes (Signed)
NEUROLOGY FOLLOW UP OFFICE NOTE  Keith Calhoun [id number]   Subjective:  Keith Calhoun a 65 year old right-handed man with hypertension, type 2 diabetes mellitus, hyperlipidemia, chronic neck and back pain, GERD, and asthma whofolllows up for migraine and tremor.  UPDATE: Topiramate was increased to 52m at bedtime back in June, which was subsequently increased to 1033mat bedtime.  He is doing well.  No recent headaches.  Tremor is controlled.  Current NSAIDS:Aspirin 325 mg Current analgesics:Acetaminophen Current triptans:none Current ergotamine:None Current anti-emetic:None Current muscle relaxants:None Current anti-anxiolytic:None Current sleep aide:None Current Antihypertensive medications:Lisinopril Current Antidepressant medications:Nortriptyline20 mg at bedtime Current Anticonvulsant medications:topiramate 10075mt bedtime; gabapentin 300 mg daily Current anti-CGRP:noneVitamins/Herbal/Supplements:Magnesium citrate 400 mg daily, riboflavin 400 mg, co-Q10 100 mg 3 times daily Current Antihistamines/Decongestants:None Other therapy:None   Caffeine:No Diet:Hydrates Exercise: He is more active at work. Depression:No; Anxiety:Yes, only because of headaches Other pain:History of chronic neck and back pain.  Sleep hygiene:Poor. Trouble falling asleep and staying asleep, often due to ongoing generalized aches and pain.  HISTORY: Onset: Age [age] old Location:Right temporal Quality:Throbbing/pressure Initial intensity:8/10.Hedenies thunderclap headache or severe headache. He may wake up with headache (as they often occur at bedtime) but headaches themselves do not wake him up. Aura:no Prodrome:no Postdrome:no Associated symptoms: Nausea, photophobia. No vomiting, phonophobia or visual disturbance.Hedenies associated unilateral numbness or weakness. Initial duration:30 minutes with Zomig 5mg45mtherwise several  hours InitialFrequency:Twice daily, always at night. InitialFrequency of abortive medication:5 to 7 days a week Triggers: Anxiety Relieving factors: Ice Activity:aggravates  Past NSAIDS: ibuprofen, Celebrex, naproxen 500mg18mt analgesics: tramadol, hydrocodone, tramadol Past abortive triptans:no Past muscle relaxants:no Past anti-emetic:no Past antihypertensive medications:no Past antidepressant medications: amitriptyline 25mg 48mk briefly for a few days to help with sleep) Past anticonvulsant medications:Topiramate 50 mg (cause nausea) Past vitamins/Herbal/Supplements:no Past antihistamines/decongestants:no Other past therapies:no  Family history of headache:No  PAST MEDICAL HISTORY: Past Medical History:  Diagnosis Date  . Allergy    seasonal allergies per pt  . Anemia    2012- turned down for donating blood, due to low count   . Anxiety    claustrophobia   . Arthritis    L knee , L shoudler   . Bronchitis   . Chronic pain   . Diabetes mellitus    dx 2007  . GERD (gastroesophageal reflux disease)   . Headache    ?? stress   . Hemorrhoids   . Hyperlipidemia   . Hypertension   . Neck pain   . 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 Wears dentures   . Wears glasses     MEDICATIONS: Current Outpatient Medications on File Prior to Visit  Medication Sig Dispense Refill  . acetaminophen (TYLENOL) 500 MG tablet Take 1,000 mg by mouth every 6 (six) hours as needed for mild pain or headache.     . albuterol (PROVENTIL HFA;VENTOLIN HFA) 108 (90 Base) MCG/ACT inhaler Inhale 2 puffs every 6 (six) hours as needed into the lungs for wheezing or shortness of breath.     . ASPIRIN PO Take by mouth.    . cetirizine (ZYRTEC) 10 MG chewable tablet Chew 10 mg by mouth daily.    . Cholecalciferol 50 MCG (2000 UT) TBDP Take by mouth.    . docusate sodium (COLACE) 50 MG capsule Take 50 mg by mouth 2 (two) times daily.    . gabaMarland Kitchenentin  (NEURONTIN) 300 MG capsule Take 300 mg by mouth at bedtime.    .Marland Kitchen  glucose blood (ACCU-CHEK GUIDE) test strip 1 each by Other route daily. And lancets 1/day 90 each 3  . latanoprost (XALATAN) 0.005 % ophthalmic solution Place 1 drop into both eyes at bedtime.    Marland Kitchen lisinopril (PRINIVIL,ZESTRIL) 40 MG tablet Take 40 mg by mouth at bedtime.    . Magnesium 400 MG TABS Take 400 mg by mouth daily. 30 tablet 5  . metFORMIN (GLUCOPHAGE-XR) 750 MG 24 hr tablet Take 750 mg by mouth 2 (two) times daily.    Marland Kitchen MOVIPREP 100 g SOLR Moviprep as directed, no substitutions 1 kit 0  . naproxen (NAPROSYN) 500 MG tablet Take 500 mg by mouth daily as needed.    . nortriptyline (PAMELOR) 10 MG capsule Take 2 capsules (20 mg total) by mouth at bedtime. 60 capsule 0  . oxymetazoline (AFRIN) 0.05 % nasal spray Place 1 spray 2 (two) times daily as needed into both nostrils for congestion.    . pravastatin (PRAVACHOL) 10 MG tablet Take 10 mg by mouth at bedtime.    . repaglinide (PRANDIN) 2 MG tablet Take 1 tablet (2 mg total) by mouth 2 (two) times daily before a meal. 180 tablet 3  . Riboflavin 400 MG TABS Take 1 tablet by mouth daily. 90 tablet 3  . topiramate (TOPAMAX) 100 MG tablet Take one capsule at bedtime 30 tablet 0  . ZOLMitriptan (ZOMIG PO) Take by mouth.     Current Facility-Administered Medications on File Prior to Visit  Medication Dose Route Frequency Provider Last Rate Last Admin  . 0.9 %  sodium chloride infusion  500 mL Intravenous Once Nandigam, Venia Minks, MD        ALLERGIES: No Known Allergies  FAMILY HISTORY: Family History  Problem Relation Age of Onset  . Heart disease Father   . Diabetes Father   . Blindness Mother   . Anesthesia problems Neg Hx   . Hypotension Neg Hx   . Malignant hyperthermia Neg Hx   . Pseudochol deficiency Neg Hx   . Colon cancer Neg Hx   . Colon polyps Neg Hx   . Esophageal cancer Neg Hx   . Rectal cancer Neg Hx   . Stomach cancer Neg Hx     SOCIAL  HISTORY: Social History   Socioeconomic History  . Marital status: Divorced    Spouse name: Not on file  . Number of children: 2  . Years of education: Not on file  . Highest education level: Some college, no degree  Occupational History  . 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 Smoking status: Former Smoker    Types: Cigarettes    Quit date: 03/13/2007    Years since quitting: 13.0  . Smokeless tobacco: Never Used  Vaping Use  . Vaping Use: Never used  Substance and Sexual Activity  . Alcohol use: Not Currently  . Drug use: No  . Sexual activity: Not on file  Other Topics Concern  . Not on file  Social History Narrative   Patient is right-handed. He takes care of his mother, she lives with him in a one story house. He is currently in P/T 3 x week. He drinks tea 3 x week.      07/16/18-Pt retired in January 2020   Social Determinants of Health   Financial Resource Strain: Not on [employer] Insecurity: Not on file  Transportation Needs: Not on file  Physical Activity: Not on  file  Stress: Not on file  Social Connections: Not on file  Intimate Partner Violence: Not on file     Objective:  Blood pressure (!) 145/85, pulse 86, resp. rate 20, height '6\' 2"'  (1.88 m), weight (!) 302 lb (137 kg), SpO2 96 %. General: No acute distress.  Patient appears well-groomed.   Head:  Normocephalic/atraumatic Eyes:  Fundi examined but not visualized Neck: supple, no paraspinal tenderness, full range of motion Heart:  Regular rate and rhythm Lungs:  Clear to auscultation bilaterally Back: No paraspinal tenderness Neurological Exam: alert and oriented to person, place, and time. Attention span and concentration intact, recent and remote memory intact, fund of knowledge intact.  Speech fluent and not dysarthric, language intact.  CN II-XII intact. Bulk and tone normal, muscle strength 5/5 throughout.  Tremor not appreciated.  Sensation to light touch  intact.  Deep tendon reflexes 2+ throughout.  Finger to nose testing intact.  Gait normal, Romberg negative.   Assessment/Plan:   1.  Migraine without aura, without status migrainosus, not intractable 2.  Essential tremor  1.  Migraine prevention:  Nortriptyline 20m at bedtime, topiramate 561mat bedtime 2.  Tremor management:  topiramate 10020mt bedtime 3.  Limit use of pain relievers to no more than 2 days out of week to prevent risk of rebound or medication-overuse headache. 4.  Keep headache diary 5.  Follow up 9 months.  AdaMetta ClinesO  CC: DebKarle PlumberD

## 2020-04-12 ENCOUNTER — Encounter: Payer: Self-pay | Admitting: Neurology

## 2020-04-12 ENCOUNTER — Other Ambulatory Visit: Payer: Self-pay

## 2020-04-12 ENCOUNTER — Ambulatory Visit (INDEPENDENT_AMBULATORY_CARE_PROVIDER_SITE_OTHER): Payer: No Typology Code available for payment source | Admitting: Neurology

## 2020-04-12 VITALS — BP 145/85 | HR 86 | Resp 20 | Ht 74.0 in | Wt 302.0 lb

## 2020-04-12 DIAGNOSIS — G250 Essential tremor: Secondary | ICD-10-CM

## 2020-04-12 DIAGNOSIS — G43009 Migraine without aura, not intractable, without status migrainosus: Secondary | ICD-10-CM

## 2020-04-12 NOTE — Patient Instructions (Signed)
Continue topiramate and nortriptyline

## 2020-04-18 ENCOUNTER — Telehealth: Payer: Self-pay | Admitting: Endocrinology

## 2020-04-18 NOTE — Telephone Encounter (Signed)
Patient advised that the [place] is going to pay.  They are currently completing paperwork

## 2020-04-20 ENCOUNTER — Ambulatory Visit: Payer: No Typology Code available for payment source | Admitting: Endocrinology

## 2020-05-26 ENCOUNTER — Other Ambulatory Visit: Payer: Self-pay

## 2020-05-26 ENCOUNTER — Ambulatory Visit (INDEPENDENT_AMBULATORY_CARE_PROVIDER_SITE_OTHER): Payer: No Typology Code available for payment source | Admitting: Endocrinology

## 2020-05-26 VITALS — BP 150/84 | HR 90 | Ht 74.0 in | Wt 298.0 lb

## 2020-05-26 DIAGNOSIS — N183 Chronic kidney disease, stage 3 unspecified: Secondary | ICD-10-CM | POA: Diagnosis not present

## 2020-05-26 DIAGNOSIS — E1122 Type 2 diabetes mellitus with diabetic chronic kidney disease: Secondary | ICD-10-CM | POA: Diagnosis not present

## 2020-05-26 DIAGNOSIS — E1169 Type 2 diabetes mellitus with other specified complication: Secondary | ICD-10-CM

## 2020-05-26 DIAGNOSIS — E669 Obesity, unspecified: Secondary | ICD-10-CM

## 2020-05-26 LAB — POCT GLYCOSYLATED HEMOGLOBIN (HGB A1C): Hemoglobin A1C: 10.3 % — AB (ref 4.0–5.6)

## 2020-05-26 MED ORDER — REPAGLINIDE 2 MG PO TABS: 2.0000 mg | ORAL_TABLET | Freq: Two times a day (BID) | ORAL | 3 refills | Status: DC

## 2020-05-26 NOTE — Patient Instructions (Addendum)
check your blood sugar once a day.  vary the time of day when you check, between before the 3 meals, and at bedtime.  also check if you have symptoms of your blood sugar being too high or too low.  please keep a record of the readings and bring it to your next appointment here (or you can bring the meter itself).  You can write it on any piece of paper.  please call us sooner if your blood sugar goes below 70, or if you have a lot of readings over 200.   I have also resent the prescription to your pharmacy, to change the glimepiride to repaglinide.   Please continue the same metformin for now, but we'll need to phase it out, due to your kidneys.   Please call or message [place] next 1-2 weeks, to tell us how the blood sugar is doing.  We may need to add "Rybelsus."   Please come back for a follow-up appointment in 2 months.

## 2020-05-26 NOTE — Progress Notes (Signed)
 Subjective:    Patient ID: Keith Calhoun, male    DOB: 07/28/1955, 65 y.o.   MRN: [id number]  HPI Pt returns for f/u of diabetes mellitus: DM type:  Dx'ed: 2015 Complications: stage 3 CRI Therapy: insulin since DKA: never Severe hypoglycemia: never Pancreatitis: never Pancreatic imaging: never SDOH: he does not check cbg's Other: he has never taken insulin Interval history: Pt says he has not yet made changed advised at last ov, as [facility] just [facility] new meds.   Past Medical History:  Diagnosis Date  . Allergy    seasonal allergies per pt  . Anemia    2012- turned down for donating blood, due to low count   . Anxiety    claustrophobia   . Arthritis    L knee , L shoudler   . Bronchitis   . Chronic pain   . Diabetes mellitus    dx 2007  . GERD (gastroesophageal reflux disease)   . Headache    ?? stress   . Hemorrhoids   . Hyperlipidemia   . Hypertension   . Neck pain   . 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 Wears dentures   . Wears glasses     Past Surgical History:  Procedure Laterality Date  . COLONOSCOPY     5-10 yrs ago per pt  . HEMORRHOID SURGERY  04/2011  . HEMORRHOID SURGERY  05/09/2011   Procedure: HEMORRHOIDECTOMY;  Surgeon: Matthew K. Tsuei, MD;  Location: [facility] OR;  Service: General;  Laterality: N/A;  . JOINT REPLACEMENT     knee (L) and hip (R)  . KNEE SURGERY  1978   left  . MULTIPLE TOOTH EXTRACTIONS    . RADIOLOGY WITH ANESTHESIA N/A 04/24/2016   Procedure: MRI - CERVICAL SPINE WITHOUT CONTRAST AND LUMBAR SPINE WITHOUT CONTRAST;  Surgeon: Medication Radiologist, MD;  Location: [facility] OR;  Service: Radiology;  Laterality: N/A;  . RADIOLOGY WITH ANESTHESIA Right 01/24/2017   Procedure: MRI RIGHT HIP WITHOUT;  Surgeon: Radiologist, Medication, MD;  Location: [facility] OR;  Service: Radiology;  Laterality: Right;  . RADIOLOGY WITH ANESTHESIA N/A 01/14/2018   Procedure: MRI WITH ANESTHESIA OF CERVICAL SPINE WITHOUT CONTRAST;  Surgeon: Radiologist,  Medication, MD;  Location: [facility] OR;  Service: Radiology;  Laterality: N/A;  . SHOULDER ARTHROSCOPY DISTAL CLAVICLE EXCISION AND OPEN ROTATOR CUFF REPAIR     L shoulder   . TONSILLECTOMY     as a child    Social History   Socioeconomic History  . Marital status: Divorced    Spouse name: Not on file  . Number of children: 2  . Years of education: Not on file  . Highest education level: Some college, no degree  Occupational History  . Occupation: retired    Employer: [employer]    Comment: FMLA  leave  Tobacco Use  . Smoking status: Former Smoker    Types: Cigarettes    Quit date: 03/13/2007    Years since quitting: 13.2  . Smokeless tobacco: Never Used  Vaping Use  . Vaping Use: Never used  Substance and Sexual Activity  . Alcohol use: Not Currently  . Drug use: No  . Sexual activity: Not on file  Other Topics Concern  . Not on file  Social History Narrative   Patient is right-handed. He takes care of his mother, she lives with him in a one story house. He is currently in P/T 3 x week. He drinks tea 3 x week.        07/16/18-Pt retired in January 2020      Social Determinants of Health   Financial Resource Strain: Not on file  Food Insecurity: Not on file  Transportation Needs: Not on file  Physical Activity: Not on file  Stress: Not on file  Social Connections: Not on file  Intimate Partner Violence: Not on file    Current Outpatient Medications on File Prior to Visit  Medication Sig Dispense Refill  . acetaminophen (TYLENOL) 500 MG tablet Take 1,000 mg by mouth every 6 (six) hours as needed for mild pain or headache.     . albuterol (PROVENTIL HFA;VENTOLIN HFA) 108 (90 Base) MCG/ACT inhaler Inhale 2 puffs every 6 (six) hours as needed into the lungs for wheezing or shortness of breath.     . ASPIRIN PO Take by mouth.    . cetirizine (ZYRTEC) 10 MG chewable tablet Chew 10 mg by mouth daily.    . Cholecalciferol 50 MCG (2000 UT) TBDP Take by mouth.    . docusate sodium  (COLACE) 50 MG capsule Take 50 mg by mouth 2 (two) times daily.    . gabapentin (NEURONTIN) 300 MG capsule Take 300 mg by mouth at bedtime.    . glucose blood (ACCU-CHEK GUIDE) test strip 1 each by Other route daily. And lancets 1/day 90 each 3  . latanoprost (XALATAN) 0.005 % ophthalmic solution Place 1 drop into both eyes at bedtime.    . lisinopril (PRINIVIL,ZESTRIL) 40 MG tablet Take 40 mg by mouth at bedtime.    . Magnesium 400 MG TABS Take 400 mg by mouth daily. 30 tablet 5  . metFORMIN (GLUCOPHAGE-XR) 750 MG 24 hr tablet Take 750 mg by mouth 2 (two) times daily.    . MOVIPREP 100 g SOLR Moviprep as directed, no substitutions 1 kit 0  . naproxen (NAPROSYN) 500 MG tablet Take 500 mg by mouth daily as needed.    . nortriptyline (PAMELOR) 10 MG capsule Take 2 capsules (20 mg total) by mouth at bedtime. 60 capsule 0  . oxymetazoline (AFRIN) 0.05 % nasal spray Place 1 spray 2 (two) times daily as needed into both nostrils for congestion.    . pravastatin (PRAVACHOL) 10 MG tablet Take 10 mg by mouth at bedtime.    . Riboflavin 400 MG TABS Take 1 tablet by mouth daily. 90 tablet 3  . topiramate (TOPAMAX) 100 MG tablet Take one capsule at bedtime (Patient taking differently: 100 mg. Take one capsule at bedtime) 30 tablet 0  . ZOLMitriptan (ZOMIG PO) Take by mouth.     Current Facility-Administered Medications on File Prior to Visit  Medication Dose Route Frequency Provider Last Rate Last Admin  . 0.9 %  sodium chloride infusion  500 mL Intravenous Once Nandigam, Kavitha V, MD        No Known Allergies  Family History  Problem Relation Age of Onset  . Heart disease Father   . Diabetes Father   . Blindness Mother   . Anesthesia problems Neg Hx   . Hypotension Neg Hx   . Malignant hyperthermia Neg Hx   . Pseudochol deficiency Neg Hx   . Colon cancer Neg Hx   . Colon polyps Neg Hx   . Esophageal cancer Neg Hx   . Rectal cancer Neg Hx   . Stomach cancer Neg Hx     BP (!) 150/84 (BP  Location: Right Arm, Patient Position: Sitting, Cuff Size: Large)   Pulse 90   Ht 6' 2" (1.88 m)     Wt 298 lb (135.2 kg)   SpO2 90%   BMI 38.26 kg/m   Review of Systems     Objective:   Physical Exam VITAL SIGNS:  See vs page GENERAL: no distress Pulses: dorsalis pedis intact bilat.   MSK: no deformity of the feet CV: no leg edema Skin:  no ulcer on the feet.  normal color and temp on the feet.   Neuro: sensation is intact to touch on the feet.    Lab Results  Component Value Date   CREATININE 1.51 (H) 03/17/2020   BUN 19 03/17/2020   NA 138 03/17/2020   K 5.0 03/17/2020   CL 102 03/17/2020   CO2 22 03/17/2020    Lab Results  Component Value Date   HGBA1C 10.3 (A) 05/26/2020      Assessment & Plan:  Type 2 DM, with stage 3 CRI: uncontrolled.   Patient Instructions  check your blood sugar once a day.  vary the time of day when you check, between before the 3 meals, and at bedtime.  also check if you have symptoms of your blood sugar being too high or too low.  please keep a record of the readings and bring it to your next appointment here (or you can bring the meter itself).  You can write it on any piece of paper.  please call us sooner if your blood sugar goes below 70, or if you have a lot of readings over 200.   I have also resent the prescription to your pharmacy, to change the glimepiride to repaglinide.   Please continue the same metformin for now, but we'll need to phase it out, due to your kidneys.   Please call or message [place] next 1-2 weeks, to tell us how the blood sugar is doing.  We may need to add "Rybelsus."   Please come back for a follow-up appointment in 2 months.

## 2020-06-01 ENCOUNTER — Telehealth: Payer: Self-pay | Admitting: Endocrinology

## 2020-06-01 NOTE — Telephone Encounter (Signed)
LVM for pt to contact the office to give [place] an update on where the [facility] is located.

## 2020-06-01 NOTE — Telephone Encounter (Signed)
[facility] need to know [facility]

## 2020-06-01 NOTE — Telephone Encounter (Signed)
Patient requests to be called at ph# [phone] re: Medication-Repaglinide. Patient has been unable to get the medication listed above from the [place]. [facility] Loanne Drilling to fax the RX for Repaglinide to [facility] at Fax# [phone]. [facility] is the Department that the [place] authorizes outside Provider's such as Dr. Loanne Drilling.

## 2020-06-01 NOTE — Telephone Encounter (Signed)
Please Advise

## 2020-06-02 MED ORDER — REPAGLINIDE 2 MG PO TABS: 2.0000 mg | ORAL_TABLET | Freq: Two times a day (BID) | ORAL | 3 refills | Status: DC

## 2020-06-02 NOTE — Telephone Encounter (Signed)
Pt was returning Patricia's call at [phone]

## 2020-06-02 NOTE — Telephone Encounter (Signed)
[facility] at Fax# [phone] [facility] location

## 2020-06-02 NOTE — Telephone Encounter (Signed)
Please see note below for location and fax.

## 2020-06-02 NOTE — Addendum Note (Signed)
Addended by: Renato Shin on: 06/02/2020 12:03 PM   Modules accepted: Orders

## 2020-06-02 NOTE — Telephone Encounter (Signed)
done

## 2020-06-03 ENCOUNTER — Telehealth: Payer: Self-pay | Admitting: Endocrinology

## 2020-06-03 NOTE — Telephone Encounter (Signed)
please contact patient: [facility].  It is generic, so you can get at another pharmacy.  Which do you want [place] to send to?

## 2020-06-03 NOTE — Telephone Encounter (Signed)
Patient called back to advise that his preference is to stay with Repaglinide as recommended by Dr Loanne Drilling.  Patient has asked that we send the 90 day RX for Repaglinide to the [facility] [address] pharmacy

## 2020-06-03 NOTE — Telephone Encounter (Signed)
[facility] called to advise that Repaglinide is not covered by insurance, but that Glipizide and Pioglitazone are covered

## 2020-06-03 NOTE — Telephone Encounter (Signed)
I LVM for pt to let him know that according to his insurance the Repaglinide is not covered by his insurance and gave him the alternatives that are covered. As stated by him in his note he wanted to stay with this medication.  Please Advsie

## 2020-06-04 MED ORDER — REPAGLINIDE 2 MG PO TABS: 2.0000 mg | ORAL_TABLET | Freq: Two times a day (BID) | ORAL | 3 refills | Status: DC

## 2020-06-04 NOTE — Telephone Encounter (Signed)
Spoke with pt to let him know that his medication has been sent to the pharmacy

## 2020-06-04 NOTE — Telephone Encounter (Signed)
OK, I have sent a prescription to [facility]

## 2020-07-15 ENCOUNTER — Ambulatory Visit: Payer: No Typology Code available for payment source | Admitting: Internal Medicine

## 2020-07-26 ENCOUNTER — Telehealth: Payer: Self-pay

## 2020-07-26 NOTE — Telephone Encounter (Signed)
Copied from [place] [id number]. Topic: General - Other >> Jul 25, 2020 12:37 PM Pawlus, Brayton Layman A wrote: Reason for CRM: Pt requested a call back [phone]) to go over some questions about a weight loss program. Please advise.   Patient had appt with Dr. Wynetta Emery on 03/17/20

## 2020-07-28 ENCOUNTER — Ambulatory Visit: Payer: No Typology Code available for payment source | Admitting: Endocrinology

## 2020-07-29 NOTE — Telephone Encounter (Signed)
Returned pt call pt didn't answer lvm  

## 2020-08-02 ENCOUNTER — Ambulatory Visit: Payer: No Typology Code available for payment source | Admitting: Endocrinology

## 2020-08-11 ENCOUNTER — Encounter (INDEPENDENT_AMBULATORY_CARE_PROVIDER_SITE_OTHER): Payer: Self-pay | Admitting: Bariatrics

## 2020-08-11 ENCOUNTER — Ambulatory Visit (INDEPENDENT_AMBULATORY_CARE_PROVIDER_SITE_OTHER): Payer: Medicare PPO | Admitting: Bariatrics

## 2020-08-11 ENCOUNTER — Other Ambulatory Visit: Payer: Self-pay

## 2020-08-11 VITALS — BP 135/79 | HR 61 | Temp 97.7°F | Ht 72.0 in | Wt 296.0 lb

## 2020-08-11 DIAGNOSIS — E1169 Type 2 diabetes mellitus with other specified complication: Secondary | ICD-10-CM | POA: Diagnosis not present

## 2020-08-11 DIAGNOSIS — Z1331 Encounter for screening for depression: Secondary | ICD-10-CM

## 2020-08-11 DIAGNOSIS — Z0289 Encounter for other administrative examinations: Secondary | ICD-10-CM

## 2020-08-11 DIAGNOSIS — E7849 Other hyperlipidemia: Secondary | ICD-10-CM

## 2020-08-11 DIAGNOSIS — Z6841 Body Mass Index (BMI) 40.0 and over, adult: Secondary | ICD-10-CM | POA: Diagnosis not present

## 2020-08-11 DIAGNOSIS — E1159 Type 2 diabetes mellitus with other circulatory complications: Secondary | ICD-10-CM

## 2020-08-11 DIAGNOSIS — E559 Vitamin D deficiency, unspecified: Secondary | ICD-10-CM

## 2020-08-11 DIAGNOSIS — R5383 Other fatigue: Secondary | ICD-10-CM

## 2020-08-11 DIAGNOSIS — R0602 Shortness of breath: Secondary | ICD-10-CM

## 2020-08-11 DIAGNOSIS — I152 Hypertension secondary to endocrine disorders: Secondary | ICD-10-CM

## 2020-08-12 LAB — COMPREHENSIVE METABOLIC PANEL
ALT: 14 IU/L (ref 0–44)
AST: 13 IU/L (ref 0–40)
Albumin/Globulin Ratio: 1.4 (ref 1.2–2.2)
Albumin: 4.2 g/dL (ref 3.8–4.8)
Alkaline Phosphatase: 94 IU/L (ref 44–121)
BUN/Creatinine Ratio: 14 (ref 10–24)
BUN: 22 mg/dL (ref 8–27)
Bilirubin Total: <0.2 mg/dL (ref 0.0–1.2)
CO2: 22 mmol/L (ref 20–29)
Calcium: 9.6 mg/dL (ref 8.6–10.2)
Chloride: 106 mmol/L (ref 96–106)
Creatinine, Ser: 1.57 mg/dL — ABNORMAL HIGH (ref 0.76–1.27)
Globulin, Total: 3.1 g/dL (ref 1.5–4.5)
Glucose: 131 mg/dL — ABNORMAL HIGH (ref 65–99)
Potassium: 5.6 mmol/L — ABNORMAL HIGH (ref 3.5–5.2)
Sodium: 142 mmol/L (ref 134–144)
Total Protein: 7.3 g/dL (ref 6.0–8.5)
eGFR: 48 mL/min/{1.73_m2} — ABNORMAL LOW (ref >59)

## 2020-08-12 LAB — T3: T3, Total: 96 ng/dL (ref 71–180)

## 2020-08-12 LAB — T4, FREE: Free T4: 1.1 ng/dL (ref 0.82–1.77)

## 2020-08-12 LAB — TSH: TSH: 0.561 u[IU]/mL (ref 0.450–4.500)

## 2020-08-12 LAB — VITAMIN D 25 HYDROXY (VIT D DEFICIENCY, FRACTURES): Vit D, 25-Hydroxy: 32.4 ng/mL (ref 30.0–100.0)

## 2020-08-16 NOTE — Progress Notes (Signed)
Chief Complaint:   OBESITY RECO SHONK (MR# [id number]) is a 65 y.o. male who presents for evaluation and treatment of obesity and related comorbidities. Current BMI is Body mass index is 40.14 kg/m. Dillion has been struggling with his weight for many years and has been unsuccessful in either losing weight, maintaining weight loss, or reaching his healthy weight goal.  Karmello does like to cook, and he does crave sweets.  Tracer is currently in the action stage of change and ready to dedicate time achieving and maintaining a healthier weight. Jong is interested in becoming our patient and working on intensive lifestyle modifications including (but not limited to) diet and exercise for weight loss.  Pietro's habits were reviewed today and are as follows: His family eats meals together, he thinks his family will eat healthier with him, his desired weight loss is 56 lbs, he has been heavy most of his life, he started gaining weight after marriage, his heaviest weight ever was 300 pounds, he has significant food cravings issues, he snacks frequently in the evenings, he skips meals frequently, he frequently makes poor food choices, he frequently eats larger portions than normal and he struggles with emotional eating.  Depression Screen Junaid's Food and Mood (modified PHQ-9) score was 16.  Depression screen PHQ 2/9 08/11/2020  Decreased Interest 0  Down, Depressed, Hopeless 2  PHQ - 2 Score 2  Altered sleeping 2  Tired, decreased energy 2  Change in appetite 2  Feeling bad or failure about yourself  2  Trouble concentrating 2  Moving slowly or fidgety/restless 2  Suicidal thoughts 2  PHQ-9 Score 16   Subjective:   1. Other fatigue Thompson admits to daytime somnolence and admits to waking up still tired. Patent has a history of symptoms of daytime fatigue. Iseah generally gets 8 hours of sleep per night, and states that he has generally restful sleep. Snoring is not present. Apneic episodes are not  present. Epworth Sleepiness Score is 15.  2. Shortness of breath on exertion Antony Haste notes increasing shortness of breath with exercising and seems to be worsening over time with weight gain. He notes getting out of breath sooner with activity than he used to. This has not gotten worse recently. Donovan denies shortness of breath at rest or orthopnea.  3. Type 2 diabetes mellitus with other specified complication, without long-term current use of insulin (HCC) Bernadette is taking metformin and Prandin. His last A1c was 10.3. He sees Endocrinology.   4. Hypertension associated with diabetes ([place]) Marcos's blood pressure is reasonably well controlled.  5. Other hyperlipidemia Kyshon is taking pravastatin.  Assessment/Plan:   1. Other fatigue Deadrick does feel that his weight is causing his energy to be lower than it should be. Fatigue may be related to obesity, depression or many other causes. Labs will be ordered, and in the meanwhile, Coleby will focus on self care including making healthy food choices, increasing physical activity and focusing on stress reduction.  - EKG 12-Lead - T3 - T4, free - TSH - VITAMIN D 25 Hydroxy (Vit-D Deficiency, Fractures)  2. Shortness of breath on exertion Taelor does feel that he gets out of breath more easily that he used to when he exercises. Duante's shortness of breath appears to be obesity related and exercise induced. He has agreed to work on weight loss and gradually increase exercise to treat his exercise induced shortness of breath. Will continue to monitor closely.  3. Type 2 diabetes mellitus  with other specified complication, without long-term current use of insulin (HCC) Alic will continue his medications. Good blood sugar control is important to decrease the likelihood of diabetic complications such as nephropathy, neuropathy, limb loss, blindness, coronary artery disease, and death. Intensive lifestyle modification including diet, exercise and weight loss are the  first line of treatment for diabetes.   - Comprehensive metabolic panel  4. Hypertension associated with diabetes ([place]) Gaylon will work on healthy weight loss and exercise to improve blood pressure control. We will watch for signs of hypotension as he continues his lifestyle modifications.  5. Other hyperlipidemia Cardiovascular risk and specific lipid/LDL goals reviewed. We discussed several lifestyle modifications today. We will check labs today. Welford will continue to work on diet, exercise and weight loss efforts. Orders and follow up as documented in patient record.   Counseling Intensive lifestyle modifications are the first line treatment for this issue. . Dietary changes: Increase soluble fiber. Decrease simple carbohydrates. . Exercise changes: Moderate to vigorous-intensity aerobic activity 150 minutes per week if tolerated. . Lipid-lowering medications: see documented in medical record.  - Comprehensive metabolic panel  6. Depression screening Javon had a positive depression screening. Depression is commonly associated with obesity and often results in emotional eating behaviors. We will monitor this closely and work on CBT to help improve the non-hunger eating patterns. Referral to Psychology may be required if no improvement is seen as he continues in our clinic.  7. Class 3 severe obesity with serious comorbidity and body mass index (BMI) of 40.0 to 44.9 in adult, unspecified obesity type [facility]) Jaevion is currently in the action stage of change and his goal is to continue with weight loss efforts. I recommend Radwan begin the structured treatment plan as follows:  He has agreed to the Category 2 Plan.  We discussed labs from 03/17/2020 and 3/22.  Exercise goals: No exercise has been prescribed at this time.   Behavioral modification strategies: increasing lean protein intake, decreasing simple carbohydrates, increasing vegetables, increasing water intake, decreasing eating out, no  skipping meals, meal planning and cooking strategies, keeping healthy foods in the home and planning for success.  He was informed of the importance of frequent follow-up visits to maximize his success with intensive lifestyle modifications for his multiple health conditions. He was informed we would discuss his lab results at his next visit unless there is a critical issue that needs to be addressed sooner. Alexsander agreed to keep his next visit at the agreed upon time to discuss these results.  Objective:   Blood pressure 135/79, pulse 61, temperature 97.7 F (36.5 C), height 6' (1.829 m), weight 296 lb (134.3 kg), SpO2 95 %. Body mass index is 40.14 kg/m.  EKG: Normal sinus rhythm, rate 57 BPM.  Indirect Calorimeter completed today shows a VO2 of 260 and a REE of 1811.  His calculated basal metabolic rate is 3614 thus his basal metabolic rate is worse than expected.  General: Cooperative, alert, well developed, in no acute distress. HEENT: Conjunctivae and lids unremarkable. Cardiovascular: Regular rhythm.  Lungs: Normal work of breathing. Neurologic: No focal deficits.   Lab Results  Component Value Date   CREATININE 1.57 (H) 08/11/2020   BUN 22 08/11/2020   NA 142 08/11/2020   K 5.6 (H) 08/11/2020   CL 106 08/11/2020   CO2 22 08/11/2020   Lab Results  Component Value Date   ALT 14 08/11/2020   AST 13 08/11/2020   ALKPHOS 94 08/11/2020   BILITOT <0.2  08/11/2020   Lab Results  Component Value Date   HGBA1C 10.3 (A) 05/26/2020   HGBA1C 10.9 (A) 03/21/2020   HGBA1C 10.7 (A) 03/17/2020   No results found for: INSULIN Lab Results  Component Value Date   TSH 0.561 08/11/2020   Lab Results  Component Value Date   CHOL 227 (H) 03/17/2020   HDL 46 03/17/2020   LDLCALC 137 (H) 03/17/2020   TRIG 248 (H) 03/17/2020   CHOLHDL 4.9 03/17/2020   Lab Results  Component Value Date   WBC 6.4 03/17/2020   HGB 13.1 03/17/2020   HCT 40.7 03/17/2020   MCV 82 03/17/2020   PLT 314  03/17/2020   No results found for: IRON, TIBC, FERRITIN Obesity Behavioral Intervention:   Approximately 15 minutes were spent on the discussion below.  ASK: We discussed the diagnosis of obesity with Antony Haste today and Hartwell agreed to give [place] permission to discuss obesity behavioral modification therapy today.  ASSESS: Cordaryl has the diagnosis of obesity and his BMI today is 40.14. Jahking is in the action stage of change.   ADVISE: Eufemio was educated on the multiple health risks of obesity as well as the benefit of weight loss to improve his health. He was advised of the need for long term treatment and the importance of lifestyle modifications to improve his current health and to decrease his risk of future health problems.  AGREE: Multiple dietary modification options and treatment options were discussed and Knoah agreed to follow the recommendations documented in the above note.  ARRANGE: Stephfon was educated on the importance of frequent visits to treat obesity as outlined per CMS and USPSTF guidelines and agreed to schedule his next follow up appointment today.  Attestation Statements:   Reviewed by clinician on day of visit: allergies, medications, problem list, medical history, surgical history, family history, social history, and previous encounter notes.   Wilhemena Durie, am acting as Location manager for [employer], DO.  I have reviewed the above documentation for accuracy and completeness, and I agree with the above. Jearld Lesch, DO

## 2020-08-17 ENCOUNTER — Encounter (INDEPENDENT_AMBULATORY_CARE_PROVIDER_SITE_OTHER): Payer: Self-pay | Admitting: Bariatrics

## 2020-08-25 ENCOUNTER — Ambulatory Visit (INDEPENDENT_AMBULATORY_CARE_PROVIDER_SITE_OTHER): Payer: Self-pay | Admitting: Bariatrics

## 2020-08-26 ENCOUNTER — Telehealth: Payer: Self-pay | Admitting: Neurology

## 2020-08-26 DIAGNOSIS — G250 Essential tremor: Secondary | ICD-10-CM

## 2020-08-26 NOTE — Telephone Encounter (Signed)
Would like a call back. He wants to know if Tomi Likens is treating him for parkinson's. He is shaking tremendously and with him drinking water from [facility]. He wants to make sure the authorization form is in good standing with [facility] so he doesn't get charged.

## 2020-08-29 MED ORDER — PRIMIDONE 50 MG PO TABS: ORAL_TABLET | ORAL | 2 refills | Status: DC

## 2020-08-29 NOTE — Telephone Encounter (Addendum)
Pt advised.   Last visit note printed and mailed to pt.

## 2020-08-29 NOTE — Telephone Encounter (Signed)
Pt advised he being seen for Essential  Tremor.  Pt wanted to know if the medications are making him gain or will they make him gain weight.  Pt wanted to make sure he is to start the new medication in addtion to the others he is taking. Or should he stop them and just take the new medication.    Pt  taking Nortriptyline 20mg  at bedtime; increase topiramate to 50mg  at bedtime. And the new medication.

## 2020-08-29 NOTE — Telephone Encounter (Signed)
Keith Calhoun called in regarding the message he missed this morning. [phone].

## 2020-08-29 NOTE — Telephone Encounter (Signed)
LMOVM. Please call the office.

## 2020-09-02 ENCOUNTER — Telehealth: Payer: Self-pay | Admitting: Neurology

## 2020-09-02 NOTE — Telephone Encounter (Signed)
Lm, he needs to know the medicine that was sent to [facility].

## 2020-09-06 NOTE — Telephone Encounter (Signed)
Called patient and informed him that his primidone was sent to the [place] on 08/29/20. Patient verbalized understanding and thanked me for the call.

## 2020-09-08 ENCOUNTER — Encounter (INDEPENDENT_AMBULATORY_CARE_PROVIDER_SITE_OTHER): Payer: Self-pay | Admitting: Bariatrics

## 2020-09-08 ENCOUNTER — Ambulatory Visit (INDEPENDENT_AMBULATORY_CARE_PROVIDER_SITE_OTHER): Payer: Medicare PPO | Admitting: Bariatrics

## 2020-09-08 ENCOUNTER — Other Ambulatory Visit: Payer: Self-pay

## 2020-09-08 VITALS — BP 120/78 | HR 85 | Temp 98.4°F | Ht 72.0 in | Wt 286.0 lb

## 2020-09-08 DIAGNOSIS — E559 Vitamin D deficiency, unspecified: Secondary | ICD-10-CM

## 2020-09-08 DIAGNOSIS — E1169 Type 2 diabetes mellitus with other specified complication: Secondary | ICD-10-CM

## 2020-09-08 DIAGNOSIS — E1159 Type 2 diabetes mellitus with other circulatory complications: Secondary | ICD-10-CM

## 2020-09-08 DIAGNOSIS — I152 Hypertension secondary to endocrine disorders: Secondary | ICD-10-CM

## 2020-09-08 DIAGNOSIS — E875 Hyperkalemia: Secondary | ICD-10-CM | POA: Diagnosis not present

## 2020-09-08 DIAGNOSIS — Z6841 Body Mass Index (BMI) 40.0 and over, adult: Secondary | ICD-10-CM | POA: Diagnosis not present

## 2020-09-08 MED ORDER — VITAMIN D (ERGOCALCIFEROL) 1.25 MG (50000 UNIT) PO CAPS: 50000.0000 [IU] | ORAL_CAPSULE | ORAL | 0 refills | Status: DC

## 2020-09-09 LAB — COMPREHENSIVE METABOLIC PANEL
ALT: 14 IU/L (ref 0–44)
AST: 17 IU/L (ref 0–40)
Albumin/Globulin Ratio: 1.6 (ref 1.2–2.2)
Albumin: 4.6 g/dL (ref 3.8–4.8)
Alkaline Phosphatase: 76 IU/L (ref 44–121)
BUN/Creatinine Ratio: 13 (ref 10–24)
BUN: 21 mg/dL (ref 8–27)
Bilirubin Total: 0.3 mg/dL (ref 0.0–1.2)
CO2: 22 mmol/L (ref 20–29)
Calcium: 9.6 mg/dL (ref 8.6–10.2)
Chloride: 108 mmol/L — ABNORMAL HIGH (ref 96–106)
Creatinine, Ser: 1.64 mg/dL — ABNORMAL HIGH (ref 0.76–1.27)
Globulin, Total: 2.8 g/dL (ref 1.5–4.5)
Glucose: 113 mg/dL — ABNORMAL HIGH (ref 65–99)
Potassium: 5.3 mmol/L — ABNORMAL HIGH (ref 3.5–5.2)
Sodium: 145 mmol/L — ABNORMAL HIGH (ref 134–144)
Total Protein: 7.4 g/dL (ref 6.0–8.5)
eGFR: 46 mL/min/{1.73_m2} — ABNORMAL LOW (ref >59)

## 2020-09-15 NOTE — Progress Notes (Signed)
Chief Complaint:   OBESITY Keith Calhoun is here to discuss his progress with his obesity treatment plan along with follow-up of his obesity related diagnoses. Keith Calhoun is on the Category 2 Plan and states he is following his eating plan approximately 0% of the time. Keith Calhoun states he is swimming 30 minutes 3 times per week.  Today's visit was #: 2 Starting weight: 296 lbs Starting date: 08/11/2020 Today's weight: 286 lbs Today's date:09/08/2020 Total lbs lost to date: 10 Total lbs lost since last in-office visit: 10  Interim History: Keith Calhoun is down 10 lbs since his last visit.  He has been eating breakfast and not skipping meals.  Subjective:   1. Type 2 diabetes mellitus with other specified complication, without long-term current use of insulin (HCC) Keith Calhoun's diabetes is not controlled. He is taking Metformin and Prandin.  2. Hypertension associated with diabetes ([place]) Keith Calhoun's hypertension is controlled.  3. Vitamin D deficiency Keith Calhoun's Vitamin D level is 32.4.  4. Hyperkalemia Keith Calhoun's CMP was 5.6.  Assessment/Plan:   1. Type 2 diabetes mellitus with other specified complication, without long-term current use of insulin (HCC) Keith Calhoun will continue his medications. Good blood sugar control is important to decrease the likelihood of diabetic complications such as nephropathy, neuropathy, limb loss, blindness, coronary artery disease, and death. Intensive lifestyle modification including diet, exercise and weight loss are the first line of treatment for diabetes.   2. Hypertension associated with diabetes [facility]) Bricen will continue his medications, and will continue working on healthy weight loss and exercise to improve blood pressure control. We will watch for signs of hypotension as he continues his lifestyle modifications.   3. Vitamin D deficiency Low Vitamin D level contributes to fatigue and are associated with obesity, breast, and colon cancer. Keith Calhoun agreed to start prescription Vitamin D 50,000  IU every week with no refills. He will follow-up for routine testing of Vitamin D, at least 2-3 times per year to avoid over-replacement.  - Vitamin D, Ergocalciferol, (DRISDOL) 1.25 MG (50000 UNIT) CAPS capsule; Take 1 capsule (50,000 Units total) by mouth every 7 (seven) days.  Dispense: 4 capsule; Refill: 0  4. Hyperkalemia We will check labs today, and Keith Calhoun will continue to follow up as directed.  - Comprehensive metabolic panel 5.6  5. Obesity, current BMI 38.8 Keith Calhoun is currently in the action stage of change. As such, his goal is to continue with weight loss efforts. He has agreed to the Category 2 Plan.   I reviewed labs from 08/11/2020 with the patient today.  Exercise goals:  As is.  Behavioral modification strategies: increasing lean protein intake, decreasing simple carbohydrates, increasing vegetables, increasing water intake, decreasing eating out, no skipping meals, meal planning and cooking strategies, keeping healthy foods in the home, and planning for success.  Keith Calhoun has agreed to follow-up with our clinic in 2 weeks. He was informed of the importance of frequent follow-up visits to maximize his success with intensive lifestyle modifications for his multiple health conditions.   Keith Calhoun was informed we would discuss his lab results at his next visit unless there is a critical issue that needs to be addressed sooner. Keith Calhoun agreed to keep his next visit at the agreed upon time to discuss these results.  Objective:   Blood pressure 120/78, pulse 85, temperature 98.4 F (36.9 C), height 6' (1.829 m), weight 286 lb (129.7 kg), SpO2 97 %. Body mass index is 38.79 kg/m.  General: Cooperative, alert, well developed, in no acute distress. HEENT: Conjunctivae  and lids unremarkable. Cardiovascular: Regular rhythm.  Lungs: Normal work of breathing. Neurologic: No focal deficits.   Lab Results  Component Value Date   CREATININE 1.64 (H) 09/08/2020   BUN 21 09/08/2020   NA 145 (H)  09/08/2020   K 5.3 (H) 09/08/2020   CL 108 (H) 09/08/2020   CO2 22 09/08/2020   Lab Results  Component Value Date   ALT 14 09/08/2020   AST 17 09/08/2020   ALKPHOS 76 09/08/2020   BILITOT 0.3 09/08/2020   Lab Results  Component Value Date   HGBA1C 10.3 (A) 05/26/2020   HGBA1C 10.9 (A) 03/21/2020   HGBA1C 10.7 (A) 03/17/2020   No results found for: INSULIN Lab Results  Component Value Date   TSH 0.561 08/11/2020   Lab Results  Component Value Date   CHOL 227 (H) 03/17/2020   HDL 46 03/17/2020   LDLCALC 137 (H) 03/17/2020   TRIG 248 (H) 03/17/2020   CHOLHDL 4.9 03/17/2020   Lab Results  Component Value Date   VD25OH 32.4 08/11/2020   Lab Results  Component Value Date   WBC 6.4 03/17/2020   HGB 13.1 03/17/2020   HCT 40.7 03/17/2020   MCV 82 03/17/2020   PLT 314 03/17/2020   No results found for: IRON, TIBC, FERRITIN  Obesity Behavioral Intervention:   Approximately 15 minutes were spent on the discussion below.  ASK: We discussed the diagnosis of obesity with Keith Calhoun today and Keith Calhoun agreed to give [place] permission to discuss obesity behavioral modification therapy today.  ASSESS: Keith Calhoun has the diagnosis of obesity and his BMI today is 38.8. Keith Calhoun is in the action stage of change.   ADVISE: Keith Calhoun was educated on the multiple health risks of obesity as well as the benefit of weight loss to improve his health. He was advised of the need for long term treatment and the importance of lifestyle modifications to improve his current health and to decrease his risk of future health problems.  AGREE: Multiple dietary modification options and treatment options were discussed and Keith Calhoun agreed to follow the recommendations documented in the above note.  ARRANGE: Keith Calhoun was educated on the importance of frequent visits to treat obesity as outlined per CMS and USPSTF guidelines and agreed to schedule his next follow up appointment today.  Attestation Statements:   Reviewed by  clinician on day of visit: allergies, medications, problem list, medical history, surgical history, family history, social history, and previous encounter notes.  I, Lizbeth Bark, RMA, am acting as Location manager for [employer], DO.   I have reviewed the above documentation for accuracy and completeness, and I agree with the above. Jearld Lesch, DO

## 2020-09-17 ENCOUNTER — Encounter (INDEPENDENT_AMBULATORY_CARE_PROVIDER_SITE_OTHER): Payer: Self-pay | Admitting: Bariatrics

## 2020-09-20 ENCOUNTER — Ambulatory Visit: Payer: No Typology Code available for payment source | Admitting: Internal Medicine

## 2020-09-21 ENCOUNTER — Telehealth: Payer: Self-pay | Admitting: Endocrinology

## 2020-09-21 NOTE — Telephone Encounter (Signed)
MEDICATION: [email or web address]   PHARMACY:   [facility] [place] [address] Phone:  [phone]  Fax:  [phone]      HAS THE PATIENT CONTACTED THEIR PHARMACY?   yes  IS THIS A 90 DAY SUPPLY : yes  IS PATIENT OUT OF MEDICATION: yes  IF NOT; HOW MUCH IS LEFT: none  LAST APPOINTMENT DATE: @3 /25/2022  NEXT APPOINTMENT DATE:@7 /25/2022  DO WE HAVE YOUR PERMISSION TO LEAVE A DETAILED MESSAGE?: yes   **Let patient know to contact pharmacy at the end of the day to make sure medication is ready. **  ** Please notify patient to allow 48-72 hours to process**  **Encourage patient to contact the pharmacy for refills or they can request refills through [facility]**

## 2020-09-29 ENCOUNTER — Ambulatory Visit (INDEPENDENT_AMBULATORY_CARE_PROVIDER_SITE_OTHER): Payer: Medicare Other | Admitting: Bariatrics

## 2020-09-29 ENCOUNTER — Encounter (INDEPENDENT_AMBULATORY_CARE_PROVIDER_SITE_OTHER): Payer: Self-pay | Admitting: Bariatrics

## 2020-09-29 ENCOUNTER — Other Ambulatory Visit: Payer: Self-pay

## 2020-09-29 VITALS — BP 102/72 | HR 58 | Temp 98.1°F | Ht 72.0 in | Wt 283.0 lb

## 2020-09-29 DIAGNOSIS — Z6841 Body Mass Index (BMI) 40.0 and over, adult: Secondary | ICD-10-CM

## 2020-09-29 DIAGNOSIS — E669 Obesity, unspecified: Secondary | ICD-10-CM

## 2020-09-29 DIAGNOSIS — E7849 Other hyperlipidemia: Secondary | ICD-10-CM

## 2020-09-29 DIAGNOSIS — E1169 Type 2 diabetes mellitus with other specified complication: Secondary | ICD-10-CM | POA: Diagnosis not present

## 2020-09-29 DIAGNOSIS — E559 Vitamin D deficiency, unspecified: Secondary | ICD-10-CM | POA: Diagnosis not present

## 2020-09-29 MED ORDER — VITAMIN D (ERGOCALCIFEROL) 1.25 MG (50000 UNIT) PO CAPS: 50000.0000 [IU] | ORAL_CAPSULE | ORAL | 0 refills | Status: DC

## 2020-10-03 ENCOUNTER — Ambulatory Visit: Payer: No Typology Code available for payment source | Admitting: Endocrinology

## 2020-10-06 ENCOUNTER — Encounter (INDEPENDENT_AMBULATORY_CARE_PROVIDER_SITE_OTHER): Payer: Self-pay | Admitting: Bariatrics

## 2020-10-06 ENCOUNTER — Telehealth: Payer: Self-pay | Admitting: Endocrinology

## 2020-10-06 NOTE — Progress Notes (Signed)
Chief Complaint:   OBESITY Keith Calhoun is here to discuss his progress with his obesity treatment plan along with follow-up of his obesity related diagnoses. Harison is on the Category 2 Plan and states he is following his eating plan approximately 50% of the time. Brayzen states he is swimming and cutting grass 30-60 minutes 1-2 times per week.  Today's visit was #: 3 Starting weight: 296 lbs Starting date: 08/11/2020 Today's weight: 283 lbs Today's date: 09/29/2020 Total lbs lost to date: 13 Total lbs lost since last in-office visit: 3  Interim History: Keith Calhoun is down another 3 lbs since his last visit. He had several events and did eat more.   Subjective:   1. Diabetes mellitus type 2 in obese [facility]) Keith Calhoun is taking Metformin and Prandin. He reports no lows.   2. Other hyperlipidemia Keith Calhoun is taking Pravachol.   3. Vitamin D deficiency Pt did not pick up Vit D prescription from the pharmacy.  Assessment/Plan:   1. Diabetes mellitus type 2 in obese (HCC) Good blood sugar control is important to decrease the likelihood of diabetic complications such as nephropathy, neuropathy, limb loss, blindness, coronary artery disease, and death. Intensive lifestyle modification including diet, exercise and weight loss are the first line of treatment for diabetes. Continue current medications and follow up with endocrinologist.  2. Other hyperlipidemia Cardiovascular risk and specific lipid/LDL goals reviewed.  We discussed several lifestyle modifications today and Xaviar will continue to work on diet, exercise and weight loss efforts. Orders and follow up as documented in patient record. Continue current treatment plan.  Counseling Intensive lifestyle modifications are the first line treatment for this issue. Dietary changes: Increase soluble fiber. Decrease simple carbohydrates. Exercise changes: Moderate to vigorous-intensity aerobic activity 150 minutes per week if tolerated. Lipid-lowering  medications: see documented in medical record.  3. Vitamin D deficiency Low Vitamin D level contributes to fatigue and are associated with obesity, breast, and colon cancer. He agrees to start to take prescription Vitamin D '@50'$ ,000 IU every week and will follow-up for routine testing of Vitamin D, at least 2-3 times per year to avoid over-replacement.  - Vitamin D, Ergocalciferol, (DRISDOL) 1.25 MG (50000 UNIT) CAPS capsule; Take 1 capsule (50,000 Units total) by mouth every 7 (seven) days.  Dispense: 4 capsule; Refill: 0  4. Obesity, current BMI 38.5  Keith Calhoun is currently in the action stage of change. As such, his goal is to continue with weight loss efforts. He has agreed to the Category 2 Plan.   Meal planning Pt will adhere closely to the plan.  Exercise goals:  As is  Behavioral modification strategies: increasing lean protein intake, decreasing simple carbohydrates, increasing vegetables, increasing water intake, decreasing eating out, no skipping meals, meal planning and cooking strategies, keeping healthy foods in the home, and planning for success.  [name] has agreed to follow-up with our clinic in 2 weeks. He was informed of the importance of frequent follow-up visits to maximize his success with intensive lifestyle modifications for his multiple health conditions.   Objective:   Blood pressure 102/72, pulse (!) 58, temperature 98.1 F (36.7 C), height 6' (1.829 m), weight 283 lb (128.4 kg), SpO2 97 %. Body mass index is 38.38 kg/m.  General: Cooperative, alert, well developed, in no acute distress. HEENT: Conjunctivae and lids unremarkable. Cardiovascular: Regular rhythm.  Lungs: Normal work of breathing. Neurologic: No focal deficits.   Lab Results  Component Value Date   CREATININE 1.64 (H) 09/08/2020   BUN 21  09/08/2020   NA 145 (H) 09/08/2020   K 5.3 (H) 09/08/2020   CL 108 (H) 09/08/2020   CO2 22 09/08/2020   Lab Results  Component Value Date   ALT 14  09/08/2020   AST 17 09/08/2020   ALKPHOS 76 09/08/2020   BILITOT 0.3 09/08/2020   Lab Results  Component Value Date   HGBA1C 10.3 (A) 05/26/2020   HGBA1C 10.9 (A) 03/21/2020   HGBA1C 10.7 (A) 03/17/2020   No results found for: INSULIN Lab Results  Component Value Date   TSH 0.561 08/11/2020   Lab Results  Component Value Date   CHOL 227 (H) 03/17/2020   HDL 46 03/17/2020   LDLCALC 137 (H) 03/17/2020   TRIG 248 (H) 03/17/2020   CHOLHDL 4.9 03/17/2020   Lab Results  Component Value Date   VD25OH 32.4 08/11/2020   Lab Results  Component Value Date   WBC 6.4 03/17/2020   HGB 13.1 03/17/2020   HCT 40.7 03/17/2020   MCV 82 03/17/2020   PLT 314 03/17/2020   No results found for: IRON, TIBC, FERRITIN  Obesity Behavioral Intervention:   Approximately 15 minutes were spent on the discussion below.  ASK: We discussed the diagnosis of obesity with Antony Haste today and Asim agreed to give [place] permission to discuss obesity behavioral modification therapy today.  ASSESS: Merit has the diagnosis of obesity and his BMI today is 38.5. Chadd is in the action stage of change.   ADVISE: Mekael was educated on the multiple health risks of obesity as well as the benefit of weight loss to improve his health. He was advised of the need for long term treatment and the importance of lifestyle modifications to improve his current health and to decrease his risk of future health problems.  AGREE: Multiple dietary modification options and treatment options were discussed and Dylyn agreed to follow the recommendations documented in the above note.  ARRANGE: Vijay was educated on the importance of frequent visits to treat obesity as outlined per CMS and USPSTF guidelines and agreed to schedule his next follow up appointment today.  Attestation Statements:   Reviewed by clinician on day of visit: allergies, medications, problem list, medical history, surgical history, family history, social history, and  previous encounter notes.  Coral Ceo, CMA, am acting as Location manager for [employer], DO.  I have reviewed the above documentation for accuracy and completeness, and I agree with the above. Jearld Lesch, DO

## 2020-10-06 NOTE — Telephone Encounter (Signed)
Pt called saying Dr.Ellison needs to send an authorization to the [place] in order to keep up his visits to see him. Pt just wanted to make sure Dr.Ellison was keeping an eye on when it needed to be renewed or if his visits had expired. Pt states there is paperwork that needs to be completed and he wants to make sure it is done right and we understand what he is asking. Pt requests a call just to go over this with the Dr or the nurse so they know what he is talking about at [phone].

## 2020-10-18 ENCOUNTER — Ambulatory Visit (INDEPENDENT_AMBULATORY_CARE_PROVIDER_SITE_OTHER): Payer: Medicare Other | Admitting: Bariatrics

## 2020-10-18 ENCOUNTER — Other Ambulatory Visit: Payer: Self-pay

## 2020-10-18 ENCOUNTER — Encounter (INDEPENDENT_AMBULATORY_CARE_PROVIDER_SITE_OTHER): Payer: Self-pay | Admitting: Bariatrics

## 2020-10-18 VITALS — BP 93/69 | HR 60 | Temp 98.4°F | Ht 72.0 in | Wt 272.0 lb

## 2020-10-18 DIAGNOSIS — I152 Hypertension secondary to endocrine disorders: Secondary | ICD-10-CM | POA: Diagnosis not present

## 2020-10-18 DIAGNOSIS — E1169 Type 2 diabetes mellitus with other specified complication: Secondary | ICD-10-CM | POA: Diagnosis not present

## 2020-10-18 DIAGNOSIS — E669 Obesity, unspecified: Secondary | ICD-10-CM

## 2020-10-18 DIAGNOSIS — Z6841 Body Mass Index (BMI) 40.0 and over, adult: Secondary | ICD-10-CM | POA: Diagnosis not present

## 2020-10-18 DIAGNOSIS — E1159 Type 2 diabetes mellitus with other circulatory complications: Secondary | ICD-10-CM | POA: Diagnosis not present

## 2020-10-19 ENCOUNTER — Encounter (INDEPENDENT_AMBULATORY_CARE_PROVIDER_SITE_OTHER): Payer: Self-pay | Admitting: Bariatrics

## 2020-10-19 NOTE — Progress Notes (Signed)
Chief Complaint:   Keith Calhoun Keith Calhoun is here to discuss his progress with his Keith Calhoun treatment plan along with follow-up of his Keith Calhoun related diagnoses. Keith Calhoun is on the Category 2 Plan and states he is following his eating plan approximately 70% of the time. Keith Calhoun states he is swimming for 30 minutes 2 times per week.  Today's visit was #: 4 Starting weight: 296 lbs Starting date: 08/11/2020 Today's weight: 272 lbs Today's date: 10/18/2020 Total lbs lost to date: 24 lbs Total lbs lost since last in-office visit: 9 lbs  Interim History: Keith Calhoun is down 9 lbs since his last visit. He has been following the plan. He has been snacking on fruits and vegetables.  Subjective:   1. Hypertension associated with diabetes (Keith Calhoun) Keith Calhoun is taking Zestril. His hypertension is controlled. Slightly decreased today.  2. Diabetes mellitus type 2 in obese Keith Calhoun) Keith Calhoun is taking Keith Calhoun and Keith Calhoun.  Assessment/Plan:   1. Hypertension associated with diabetes (Keith Calhoun) Keith Calhoun will continue medications. He is working on healthy weight loss and exercise to improve blood pressure control. We will watch for signs of hypotension as he continues his lifestyle modifications.   2. Diabetes mellitus type 2 in obese Keith Calhoun) Keith Calhoun will continue his medications. Good blood sugar control is important to decrease the likelihood of diabetic complications such as nephropathy, neuropathy, limb loss, blindness, coronary artery disease, and death. Intensive lifestyle modification including diet, exercise and weight loss are the first line of treatment for diabetes.    3. Keith Calhoun, current BMI 37 Keith Calhoun is currently in the action stage of change. As such, his goal is to continue with weight loss efforts. He has agreed to the Category 2 Plan.   Keith Calhoun will continue meal planning. He will continue intentional eating. He will be mindful eating. He will keep the H2O intake high. He will use seasoning sheet.  Exercise goals:  Keith Calhoun will be  more actively.  Behavioral modification strategies: increasing lean protein intake, decreasing simple carbohydrates, increasing vegetables, increasing water intake, decreasing eating out, no skipping meals, meal planning and cooking strategies, keeping healthy foods in the home, and planning for success.  Keith Calhoun has agreed to follow-up with our clinic in 2 weeks. He was informed of the importance of frequent follow-up visits to maximize his success with intensive lifestyle modifications for his multiple health conditions.   Objective:   Blood pressure 93/69, pulse 60, temperature 98.4 F (36.9 C), height 6' (1.829 m), weight 272 lb (123.4 kg), SpO2 98 %. Body mass index is 36.89 kg/m.  General: Cooperative, alert, well developed, in no acute distress. HEENT: Conjunctivae and lids unremarkable. Cardiovascular: Regular rhythm.  Lungs: Normal work of breathing. Neurologic: No focal deficits.   Lab Results  Component Value Date   CREATININE 1.64 (H) 09/08/2020   BUN 21 09/08/2020   NA 145 (H) 09/08/2020   K 5.3 (H) 09/08/2020   CL 108 (H) 09/08/2020   CO2 22 09/08/2020   Lab Results  Component Value Date   ALT 14 09/08/2020   AST 17 09/08/2020   ALKPHOS 76 09/08/2020   BILITOT 0.3 09/08/2020   Lab Results  Component Value Date   HGBA1C 10.3 (A) 05/26/2020   HGBA1C 10.9 (A) 03/21/2020   HGBA1C 10.7 (A) 03/17/2020   No results found for: INSULIN Lab Results  Component Value Date   TSH 0.561 08/11/2020   Lab Results  Component Value Date   CHOL 227 (H) 03/17/2020   HDL 46 03/17/2020   Keith Calhoun  137 (H) 03/17/2020   TRIG 248 (H) 03/17/2020   CHOLHDL 4.9 03/17/2020   Lab Results  Component Value Date   VD25OH 32.4 08/11/2020   Lab Results  Component Value Date   WBC 6.4 03/17/2020   HGB 13.1 03/17/2020   HCT 40.7 03/17/2020   MCV 82 03/17/2020   PLT 314 03/17/2020   No results found for: IRON, TIBC, FERRITIN  Keith Calhoun Behavioral Intervention:   Approximately 15  minutes were spent on the discussion below.  ASK: We discussed the diagnosis of Keith Calhoun with Keith Calhoun today and Keith Calhoun agreed to give [place] permission to discuss Keith Calhoun behavioral modification therapy today.  ASSESS: Keith Calhoun has the diagnosis of Keith Calhoun and his BMI today is 37.0. Keith Calhoun is in the action stage of change.   ADVISE: Keith Calhoun was educated on the multiple health risks of Keith Calhoun as well as the benefit of weight loss to improve his health. He was advised of the need for long term treatment and the importance of lifestyle modifications to improve his current health and to decrease his risk of future health problems.  AGREE: Multiple dietary modification options and treatment options were discussed and [name] agreed to follow the recommendations documented in the above note.  ARRANGE: Keith Calhoun was educated on the importance of frequent visits to treat Keith Calhoun as outlined per CMS and USPSTF guidelines and agreed to schedule his next follow up appointment today.  Attestation Statements:   Reviewed by clinician on day of visit: allergies, medications, problem list, medical history, surgical history, family history, social history, and previous encounter notes.  I, Lizbeth Bark, RMA, am acting as Location manager for [employer], DO.   I have reviewed the above documentation for accuracy and completeness, and I agree with the above. Jearld Lesch, DO

## 2020-11-01 ENCOUNTER — Encounter (INDEPENDENT_AMBULATORY_CARE_PROVIDER_SITE_OTHER): Payer: Self-pay | Admitting: Bariatrics

## 2020-11-01 ENCOUNTER — Other Ambulatory Visit: Payer: Self-pay

## 2020-11-01 ENCOUNTER — Ambulatory Visit (INDEPENDENT_AMBULATORY_CARE_PROVIDER_SITE_OTHER): Payer: Medicare Other | Admitting: Bariatrics

## 2020-11-01 VITALS — BP 109/71 | HR 77 | Temp 98.2°F | Ht 72.0 in | Wt 272.0 lb

## 2020-11-01 DIAGNOSIS — E7849 Other hyperlipidemia: Secondary | ICD-10-CM

## 2020-11-01 DIAGNOSIS — E1169 Type 2 diabetes mellitus with other specified complication: Secondary | ICD-10-CM | POA: Diagnosis not present

## 2020-11-01 DIAGNOSIS — Z6841 Body Mass Index (BMI) 40.0 and over, adult: Secondary | ICD-10-CM

## 2020-11-01 DIAGNOSIS — E669 Obesity, unspecified: Secondary | ICD-10-CM | POA: Diagnosis not present

## 2020-11-02 ENCOUNTER — Encounter (INDEPENDENT_AMBULATORY_CARE_PROVIDER_SITE_OTHER): Payer: Self-pay | Admitting: Bariatrics

## 2020-11-02 NOTE — Progress Notes (Signed)
Chief Complaint:   OBESITY Avi is here to discuss his progress with his obesity treatment plan along with follow-up of his obesity related diagnoses. Amond is on the Category 2 Plan and states he is following his eating plan approximately 80% of the time. Heshimu states he is doing 0 minutes 0 times per week.  Today's visit was #: 5 Starting weight: 296 lbs Starting date: 08/11/2020 Today's weight: 272 lbs Today's date:11/01/2020 Total lbs lost to date: 24 lbs Total lbs lost since last in-office visit: 0  Interim History: Lamarion has maintained his weight even though he had been at the beach. He is getting adequate protein and water.  Subjective:   1. Diabetes mellitus type 2 in obese [facility]) Fran is currently taking Prandin and Metformin.  2. Other hyperlipidemia Cameryn is currently taking Pravachol.  Assessment/Plan:   1. Diabetes mellitus type 2 in obese [facility]) Rylynn will continue taking his medications. Good blood sugar control is important to decrease the likelihood of diabetic complications such as nephropathy, neuropathy, limb loss, blindness, coronary artery disease, and death. Intensive lifestyle modification including diet, exercise and weight loss are the first line of treatment for diabetes.    2. Other hyperlipidemia Cardiovascular risk and specific lipid/LDL goals reviewed.  We discussed several lifestyle modifications today and Octavio will continue to work on diet, exercise and weight loss efforts. Miykael will continue taking his medications. Orders and follow up as documented in patient record.   Counseling Intensive lifestyle modifications are the first line treatment for this issue. Dietary changes: Increase soluble fiber. Decrease simple carbohydrates. Exercise changes: Moderate to vigorous-intensity aerobic activity 150 minutes per week if tolerated. Lipid-lowering medications: see documented in medical record.   3. Obesity, current BMI 36.9 Jarick is currently in the  action stage of change. As such, his goal is to continue with weight loss efforts. He has agreed to the Category 2 Plan.   Donyea will continue meal planning. He will continue intentional eating.  Exercise goals: No exercise has been prescribed at this time.  Behavioral modification strategies: increasing lean protein intake, decreasing simple carbohydrates, increasing vegetables, increasing water intake, decreasing eating out, no skipping meals, meal planning and cooking strategies, keeping healthy foods in the home, and planning for success.  Hisashi has agreed to follow-up with our clinic in 2-3 weeks. He was informed of the importance of frequent follow-up visits to maximize his success with intensive lifestyle modifications for his multiple health conditions.   Objective:   Blood pressure 109/71, pulse 77, temperature 98.2 F (36.8 C), temperature source Oral, height 6' (1.829 m), weight 272 lb (123.4 kg), SpO2 95 %. Body mass index is 36.89 kg/m.  General: Cooperative, alert, well developed, in no acute distress. HEENT: Conjunctivae and lids unremarkable. Cardiovascular: Regular rhythm.  Lungs: Normal work of breathing. Neurologic: No focal deficits.   Lab Results  Component Value Date   CREATININE 1.64 (H) 09/08/2020   BUN 21 09/08/2020   NA 145 (H) 09/08/2020   K 5.3 (H) 09/08/2020   CL 108 (H) 09/08/2020   CO2 22 09/08/2020   Lab Results  Component Value Date   ALT 14 09/08/2020   AST 17 09/08/2020   ALKPHOS 76 09/08/2020   BILITOT 0.3 09/08/2020   Lab Results  Component Value Date   HGBA1C 10.3 (A) 05/26/2020   HGBA1C 10.9 (A) 03/21/2020   HGBA1C 10.7 (A) 03/17/2020   No results found for: INSULIN Lab Results  Component Value Date  TSH 0.561 08/11/2020   Lab Results  Component Value Date   CHOL 227 (H) 03/17/2020   HDL 46 03/17/2020   LDLCALC 137 (H) 03/17/2020   TRIG 248 (H) 03/17/2020   CHOLHDL 4.9 03/17/2020   Lab Results  Component Value Date    VD25OH 32.4 08/11/2020   Lab Results  Component Value Date   WBC 6.4 03/17/2020   HGB 13.1 03/17/2020   HCT 40.7 03/17/2020   MCV 82 03/17/2020   PLT 314 03/17/2020   No results found for: IRON, TIBC, FERRITIN  Obesity Behavioral Intervention:   Approximately 15 minutes were spent on the discussion below.  ASK: We discussed the diagnosis of obesity with Antony Haste today and Haben agreed to give [place] permission to discuss obesity behavioral modification therapy today.  ASSESS: Nouh has the diagnosis of obesity and his BMI today is 36.9. Nasri is in the action stage of change.   ADVISE: Armon was educated on the multiple health risks of obesity as well as the benefit of weight loss to improve his health. He was advised of the need for long term treatment and the importance of lifestyle modifications to improve his current health and to decrease his risk of future health problems.  AGREE: Multiple dietary modification options and treatment options were discussed and Derrelle agreed to follow the recommendations documented in the above note.  ARRANGE: Kristhian was educated on the importance of frequent visits to treat obesity as outlined per CMS and USPSTF guidelines and agreed to schedule his next follow up appointment today.  Attestation Statements:   Reviewed by clinician on day of visit: allergies, medications, problem list, medical history, surgical history, family history, social history, and previous encounter notes.  I, Lizbeth Bark, RMA, am acting as Location manager for [employer], DO.   I have reviewed the above documentation for accuracy and completeness, and I agree with the above. Jearld Lesch, DO

## 2020-11-23 ENCOUNTER — Encounter (INDEPENDENT_AMBULATORY_CARE_PROVIDER_SITE_OTHER): Payer: Self-pay | Admitting: Bariatrics

## 2020-11-23 ENCOUNTER — Other Ambulatory Visit: Payer: Self-pay

## 2020-11-23 ENCOUNTER — Ambulatory Visit (INDEPENDENT_AMBULATORY_CARE_PROVIDER_SITE_OTHER): Payer: Medicare Other | Admitting: Bariatrics

## 2020-11-23 VITALS — BP 105/71 | HR 96 | Temp 98.2°F | Ht 72.0 in | Wt 263.0 lb

## 2020-11-23 DIAGNOSIS — Z6841 Body Mass Index (BMI) 40.0 and over, adult: Secondary | ICD-10-CM | POA: Diagnosis not present

## 2020-11-23 DIAGNOSIS — E1159 Type 2 diabetes mellitus with other circulatory complications: Secondary | ICD-10-CM

## 2020-11-23 DIAGNOSIS — I152 Hypertension secondary to endocrine disorders: Secondary | ICD-10-CM | POA: Diagnosis not present

## 2020-11-23 DIAGNOSIS — E1169 Type 2 diabetes mellitus with other specified complication: Secondary | ICD-10-CM | POA: Diagnosis not present

## 2020-11-24 ENCOUNTER — Encounter (INDEPENDENT_AMBULATORY_CARE_PROVIDER_SITE_OTHER): Payer: Self-pay | Admitting: Bariatrics

## 2020-11-24 NOTE — Progress Notes (Signed)
Chief Complaint:   OBESITY Keith Calhoun is here to discuss his progress with his obesity treatment plan along with follow-up of his obesity related diagnoses. Keith Calhoun is on the Category 2 Plan and states he is following his eating plan approximately 80% of the time. Keith Calhoun states he is doing yard work for 60 minutes 1 times per week and swimming for 60 minutes 2 times per week.  Today's visit was #: 6 Starting weight: 296 lbs Starting date: 08/11/2020 Today's weight: 263 lbs Today's date: 11/23/2020 Total lbs lost to date: 33 lbs Total lbs lost since last in-office visit: 9 lbs  Interim History: Keith Calhoun is down another 9 lbs and doing well overall. He states that his 2 weeks have been routine. His energy level is improved.   Subjective:   1. Type 2 diabetes mellitus with other specified complication, without long-term current use of insulin (HCC) Keith Calhoun will continue taking Glucophage.   Lab Results  Component Value Date   HGBA1C 10.3 (A) 05/26/2020   HGBA1C 10.9 (A) 03/21/2020   HGBA1C 10.7 (A) 03/17/2020   Lab Results  Component Value Date   LDLCALC 137 (H) 03/17/2020   CREATININE 1.64 (H) 09/08/2020   No results found for: INSULIN   2. Hypertension associated with diabetes ([place]) Keith Calhoun's hypertension is controlled.   BP Readings from Last 3 Encounters:  11/23/20 105/71  11/01/20 109/71  10/18/20 93/69     Assessment/Plan:   1. Type 2 diabetes mellitus with other specified complication, without long-term current use of insulin (HCC) Shahram will continue medications. He will continue weight loss and decrease carbohydrates. Good blood sugar control is important to decrease the likelihood of diabetic complications such as nephropathy, neuropathy, limb loss, blindness, coronary artery disease, and death. Intensive lifestyle modification including diet, exercise and weight loss are the first line of treatment for diabetes.   2. Hypertension associated with diabetes ([place]) Keith Calhoun will  continue medications. He is working on healthy weight loss and exercise to improve blood pressure control. We will watch for signs of hypotension as he continues his lifestyle modifications.  3. Obesity, current BMI 35.8 Keith Calhoun is currently in the action stage of change. As such, his goal is to continue with weight loss efforts. He has agreed to the Category 2 Plan.   Keith Calhoun will continue meal planning and intentional eating.  Exercise goals:  Keith Calhoun will continue doing yard work.  Behavioral modification strategies: increasing lean protein intake, decreasing simple carbohydrates, increasing vegetables, increasing water intake, decreasing eating out, no skipping meals, meal planning and cooking strategies, keeping healthy foods in the home, and planning for success.  Keith Calhoun has agreed to follow-up with our clinic in 2-3 weeks (fasting). He was informed of the importance of frequent follow-up visits to maximize his success with intensive lifestyle modifications for his multiple health conditions.   Objective:   Blood pressure 105/71, pulse 96, temperature 98.2 F (36.8 C), height 6' (1.829 m), weight 263 lb (119.3 kg), SpO2 96 %. Body mass index is 35.67 kg/m.  General: Cooperative, alert, well developed, in no acute distress. HEENT: Conjunctivae and lids unremarkable. Cardiovascular: Regular rhythm.  Lungs: Normal work of breathing. Neurologic: No focal deficits.   Lab Results  Component Value Date   CREATININE 1.64 (H) 09/08/2020   BUN 21 09/08/2020   NA 145 (H) 09/08/2020   K 5.3 (H) 09/08/2020   CL 108 (H) 09/08/2020   CO2 22 09/08/2020   Lab Results  Component Value Date   ALT  14 09/08/2020   AST 17 09/08/2020   ALKPHOS 76 09/08/2020   BILITOT 0.3 09/08/2020   Lab Results  Component Value Date   HGBA1C 10.3 (A) 05/26/2020   HGBA1C 10.9 (A) 03/21/2020   HGBA1C 10.7 (A) 03/17/2020   No results found for: INSULIN Lab Results  Component Value Date   TSH 0.561 08/11/2020    Lab Results  Component Value Date   CHOL 227 (H) 03/17/2020   HDL 46 03/17/2020   LDLCALC 137 (H) 03/17/2020   TRIG 248 (H) 03/17/2020   CHOLHDL 4.9 03/17/2020   Lab Results  Component Value Date   VD25OH 32.4 08/11/2020   Lab Results  Component Value Date   WBC 6.4 03/17/2020   HGB 13.1 03/17/2020   HCT 40.7 03/17/2020   MCV 82 03/17/2020   PLT 314 03/17/2020   No results found for: IRON, TIBC, FERRITIN  Obesity Behavioral Intervention:   Approximately 15 minutes were spent on the discussion below.  ASK: We discussed the diagnosis of obesity with Keith Calhoun today and Keith Calhoun agreed to give [place] permission to discuss obesity behavioral modification therapy today.  ASSESS: Keith Calhoun has the diagnosis of obesity and his BMI today is 35.8. Keith Calhoun is in the action stage of change.   ADVISE: Keith Calhoun was educated on the multiple health risks of obesity as well as the benefit of weight loss to improve his health. He was advised of the need for long term treatment and the importance of lifestyle modifications to improve his current health and to decrease his risk of future health problems.  AGREE: Multiple dietary modification options and treatment options were discussed and Keith Calhoun agreed to follow the recommendations documented in the above note.  ARRANGE: Keith Calhoun was educated on the importance of frequent visits to treat obesity as outlined per CMS and USPSTF guidelines and agreed to schedule his next follow up appointment today.  Attestation Statements:   Reviewed by clinician on day of visit: allergies, medications, problem list, medical history, surgical history, family history, social history, and previous encounter notes.   I, Keith Calhoun, RMA, am acting as Location manager for [employer], DO.   I have reviewed the above documentation for accuracy and completeness, and I agree with the above. Keith Lesch, DO

## 2020-12-01 ENCOUNTER — Other Ambulatory Visit: Payer: Self-pay

## 2020-12-01 ENCOUNTER — Ambulatory Visit (INDEPENDENT_AMBULATORY_CARE_PROVIDER_SITE_OTHER): Payer: Medicare Other | Admitting: Endocrinology

## 2020-12-01 VITALS — BP 116/70 | HR 56 | Ht 72.0 in | Wt 270.4 lb

## 2020-12-01 DIAGNOSIS — E669 Obesity, unspecified: Secondary | ICD-10-CM | POA: Diagnosis not present

## 2020-12-01 DIAGNOSIS — E1122 Type 2 diabetes mellitus with diabetic chronic kidney disease: Secondary | ICD-10-CM

## 2020-12-01 DIAGNOSIS — N183 Chronic kidney disease, stage 3 unspecified: Secondary | ICD-10-CM

## 2020-12-01 DIAGNOSIS — E1169 Type 2 diabetes mellitus with other specified complication: Secondary | ICD-10-CM | POA: Diagnosis not present

## 2020-12-01 LAB — POCT GLYCOSYLATED HEMOGLOBIN (HGB A1C): Hemoglobin A1C: 6.8 % — AB (ref 4.0–5.6)

## 2020-12-01 MED ORDER — METFORMIN HCL ER 750 MG PO TB24: 750.0000 mg | ORAL_TABLET | Freq: Every day | ORAL | 3 refills | Status: DC

## 2020-12-01 NOTE — Patient Instructions (Signed)
check your blood sugar once a day.  vary the time of day when you check, between before the 3 meals, and at bedtime.  also check if you have symptoms of your blood sugar being too high or too low.  please keep a record of the readings and bring it to your next appointment here (or you can bring the meter itself).  You can write it on any piece of paper.  please call us sooner if your blood sugar goes below 70, or if you have a lot of readings over 200.   I have also resent the prescription to your pharmacy, to reduce the metformin to 1 pill per day Please continue the same repaglinide. Please come back for a follow-up appointment in 2-3 months.

## 2020-12-01 NOTE — Progress Notes (Signed)
Subjective:    Patient ID: Keith Calhoun, male    DOB: Oct 13, 1955, 65 y.o.   MRN: [id number]  HPI Pt returns for f/u of diabetes mellitus: DM type: 2 Dx'ed: 6967 Complications: stage 3 CRI Therapy: 2 oral meds.   DKA: never Severe hypoglycemia: never Pancreatitis: never Pancreatic imaging: never SDOH: he does not check cbg's.   Other: he has never taken insulin Interval history: he takes meds as rx'ed.  pt states he feels well in general.   Past Medical History:  Diagnosis Date   Allergy    seasonal allergies per pt   Anemia    2012- turned down for donating blood, due to low count    Anxiety    claustrophobia    Arthritis    L knee , L shoudler    Bronchitis    Chronic pain    Diabetes mellitus    dx 2007   GERD (gastroesophageal reflux disease)    Headache    ?? 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 Georgette Dover, MD;  Location: [facility];  Service: General;  Laterality: N/A;   JOINT REPLACEMENT     knee (L) and hip (R)   KNEE SURGERY  1978   left   MULTIPLE TOOTH EXTRACTIONS     RADIOLOGY WITH ANESTHESIA N/A 04/24/2016   Procedure: MRI - CERVICAL SPINE WITHOUT CONTRAST AND LUMBAR SPINE WITHOUT CONTRAST;  Surgeon: Medication Radiologist, MD;  Location: [facility];  Service: Radiology;  Laterality: N/A;   RADIOLOGY WITH ANESTHESIA Right 01/24/2017   Procedure: MRI RIGHT HIP WITHOUT;  Surgeon: Radiologist, Medication, MD;  Location: [facility];  Service: Radiology;  Laterality: Right;   RADIOLOGY WITH ANESTHESIA N/A 01/14/2018   Procedure: MRI WITH ANESTHESIA OF CERVICAL SPINE WITHOUT CONTRAST;  Surgeon: Radiologist, Medication, MD;  Location: [facility];  Service:  Radiology;  Laterality: N/A;   SHOULDER ARTHROSCOPY DISTAL CLAVICLE EXCISION AND OPEN ROTATOR CUFF REPAIR     L shoulder    TONSILLECTOMY     as a child    Social History   Socioeconomic History   Marital status: Divorced    Spouse name: Not on file   Number of children: 2   Years of education: Not on file   Highest education level: Some college, no degree  Occupational History   Occupation: retired    Fish farm manager: [employer]    Comment: FMLA  leave  Tobacco Use   Smoking status: Former    Types: Cigarettes    Quit date: 03/13/2007    Years since quitting: 13.7   Smokeless tobacco: Never  Vaping Use   Vaping Use: Never used  Substance and Sexual Activity   Alcohol use: Not Currently   Drug use: No   Sexual activity: Not on file  Other Topics Concern   Not on file  Social History Narrative   Patient is right-handed. He takes care of his mother, she lives with him in a one story house. He is currently in P/T 3 x week. He drinks tea 3 x week.  07/16/18-Pt retired in January 2020      Social Determinants of Radio broadcast assistant Strain: Not on [employer] Insecurity: Not on file  Transportation Needs: Not on file  Physical Activity: Not on file  Stress: Not on file  Social Connections: Not on file  Intimate Partner Violence: Not on file    Current Outpatient Medications on File Prior to Visit  Medication Sig Dispense Refill   acetaminophen (TYLENOL) 500 MG tablet Take 1,000 mg by mouth every 6 (six) hours as needed for mild pain or headache.      albuterol (PROVENTIL HFA;VENTOLIN HFA) 108 (90 Base) MCG/ACT inhaler Inhale 2 puffs every 6 (six) hours as needed into the lungs for wheezing or shortness of breath.      ASPIRIN PO Take by mouth.     cetirizine (ZYRTEC) 10 MG chewable tablet Chew 10 mg by mouth daily.     Cholecalciferol 50 MCG (2000 UT) TBDP Take by mouth.     docusate sodium (COLACE) 50 MG capsule Take 50 mg by mouth 2 (two) times daily.     gabapentin  (NEURONTIN) 300 MG capsule Take 300 mg by mouth at bedtime.     glucose blood (ACCU-CHEK GUIDE) test strip 1 each by Other route daily. And lancets 1/day 90 each 3   latanoprost (XALATAN) 0.005 % ophthalmic solution Place 1 drop into both eyes at bedtime.     lisinopril (PRINIVIL,ZESTRIL) 40 MG tablet Take 40 mg by mouth at bedtime.     Magnesium 400 MG TABS Take 400 mg by mouth daily. 30 tablet 5   MOVIPREP 100 g SOLR Moviprep as directed, no substitutions 1 kit 0   naproxen (NAPROSYN) 500 MG tablet Take 500 mg by mouth daily as needed.     nortriptyline (PAMELOR) 10 MG capsule Take 2 capsules (20 mg total) by mouth at bedtime. 60 capsule 0   oxymetazoline (AFRIN) 0.05 % nasal spray Place 1 spray 2 (two) times daily as needed into both nostrils for congestion.     pravastatin (PRAVACHOL) 10 MG tablet Take 10 mg by mouth at bedtime.     primidone (MYSOLINE) 50 MG tablet Take 1/2 tablet at bedtime for one week, then increase to 1 tablet at bedtime. 30 tablet 2   repaglinide (PRANDIN) 2 MG tablet Take 1 tablet (2 mg total) by mouth 2 (two) times daily before a meal. 180 tablet 3   Riboflavin 400 MG TABS Take 1 tablet by mouth daily. 90 tablet 3   topiramate (TOPAMAX) 100 MG tablet Take one capsule at bedtime (Patient taking differently: 100 mg. Take one capsule at bedtime) 30 tablet 0   Vitamin D, Ergocalciferol, (DRISDOL) 1.25 MG (50000 UNIT) CAPS capsule Take 1 capsule (50,000 Units total) by mouth every 7 (seven) days. 4 capsule 0   ZOLMitriptan (ZOMIG PO) Take by mouth.     Current Facility-Administered Medications on File Prior to Visit  Medication Dose Route Frequency Provider Last Rate Last Admin   0.9 %  sodium chloride infusion  500 mL Intravenous Once Nandigam, Venia Minks, MD        No Known Allergies  Family History  Problem Relation Age of Onset   Heart disease Father    Diabetes Father    Hypertension Father    Hyperlipidemia Father    Heart attack Father    Blindness Mother     Diabetes Mother    Anesthesia problems Neg Hx    Hypotension Neg Hx  Malignant hyperthermia Neg Hx    Pseudochol deficiency Neg Hx    Colon cancer Neg Hx    Colon polyps Neg Hx    Esophageal cancer Neg Hx    Rectal cancer Neg Hx    Stomach cancer Neg Hx     BP 116/70 (BP Location: Right Arm, Patient Position: Sitting, Cuff Size: Large)   Pulse (!) 56   Ht 6' (1.829 m)   Wt 270 lb 6.4 oz (122.7 kg)   SpO2 97%   BMI 36.67 kg/m    Review of Systems He has lost 30 lbs since last ov--intentional.      Objective:   Physical Exam Pulses: dorsalis pedis intact bilat.   MSK: no deformity of the feet CV: no leg edema Skin:  no ulcer on the feet.  normal color and temp on the feet. Neuro: sensation is intact to touch on the feet.     Lab Results  Component Value Date   CREATININE 1.64 (H) 09/08/2020   BUN 21 09/08/2020   NA 145 (H) 09/08/2020   K 5.3 (H) 09/08/2020   CL 108 (H) 09/08/2020   CO2 22 09/08/2020   Lab Results  Component Value Date   HGBA1C 6.8 (A) 12/01/2020      Assessment & Plan:  Type 2 DM: well-controlled.  CRI: reduce metformin: Lab is not available today, to do fructosamine.    Patient Instructions  check your blood sugar once a day.  vary the time of day when you check, between before the 3 meals, and at bedtime.  also check if you have symptoms of your blood sugar being too high or too low.  please keep a record of the readings and bring it to your next appointment here (or you can bring the meter itself).  You can write it on any piece of paper.  please call us sooner if your blood sugar goes below 70, or if you have a lot of readings over 200.   I have also resent the prescription to your pharmacy, to reduce the metformin to 1 pill per day Please continue the same repaglinide. Please come back for a follow-up appointment in 2-3 months.

## 2020-12-15 ENCOUNTER — Ambulatory Visit (INDEPENDENT_AMBULATORY_CARE_PROVIDER_SITE_OTHER): Payer: Medicare Other | Admitting: Bariatrics

## 2020-12-19 ENCOUNTER — Telehealth: Payer: Self-pay | Admitting: Neurology

## 2020-12-19 ENCOUNTER — Other Ambulatory Visit: Payer: Self-pay

## 2020-12-19 ENCOUNTER — Encounter (INDEPENDENT_AMBULATORY_CARE_PROVIDER_SITE_OTHER): Payer: Self-pay | Admitting: Physician Assistant

## 2020-12-19 ENCOUNTER — Ambulatory Visit (INDEPENDENT_AMBULATORY_CARE_PROVIDER_SITE_OTHER): Payer: Medicare Other | Admitting: Physician Assistant

## 2020-12-19 VITALS — BP 104/61 | HR 51 | Temp 98.3°F | Ht 72.0 in | Wt 273.0 lb

## 2020-12-19 DIAGNOSIS — E559 Vitamin D deficiency, unspecified: Secondary | ICD-10-CM | POA: Diagnosis not present

## 2020-12-19 DIAGNOSIS — Z6841 Body Mass Index (BMI) 40.0 and over, adult: Secondary | ICD-10-CM

## 2020-12-19 DIAGNOSIS — E7849 Other hyperlipidemia: Secondary | ICD-10-CM

## 2020-12-19 DIAGNOSIS — E1169 Type 2 diabetes mellitus with other specified complication: Secondary | ICD-10-CM

## 2020-12-19 MED ORDER — VITAMIN D (ERGOCALCIFEROL) 1.25 MG (50000 UNIT) PO CAPS: 50000.0000 [IU] | ORAL_CAPSULE | ORAL | 0 refills | Status: DC

## 2020-12-19 NOTE — Telephone Encounter (Signed)
Patient has appt with Keith Calhoun on 01-10-21, he states that he is having really bad migraines every day and the medication is not working. He is not sleeping  please call

## 2020-12-19 NOTE — Progress Notes (Signed)
Chief Complaint:   OBESITY Keith Calhoun is here to discuss his progress with his obesity treatment plan along with follow-up of his obesity related diagnoses. Keith Calhoun is on the Category 2 Plan and states he is following his eating plan approximately 80% of the time. Keith Calhoun states he is not currently exercising.  Today's visit was #: 7 Starting weight: 296 lbs Starting date: 08/11/2020 Today's weight: 273 lbs Today's date: 12/19/2020 Total lbs lost to date: 23 Total lbs lost since last in-office visit: 0  Interim History: Keith Calhoun has not been very sore since seeing a chiropractor and he has not been exercising. He is more sedentary. He feels he is overeating his calories.  Subjective:   1. Type 2 diabetes mellitus with other specified complication, without long-term current use of insulin (HCC) Keith Calhoun's last A1c was 6.8. He is managed by Dr[facility] Loanne [facility]. He takes Metformin and Prandin.  2. Other hyperlipidemia Keith Calhoun is on pravastatin and tolerating it well.  3. Vitamin D deficiency Keith Calhoun is taking prescription Vit D and tolerating it well.  Assessment/Plan:   1. Type 2 diabetes mellitus with other specified complication, without long-term current use of insulin (HCC) Good blood sugar control is important to decrease the likelihood of diabetic complications such as nephropathy, neuropathy, limb loss, blindness, coronary artery disease, and death. Intensive lifestyle modification including diet, exercise and weight loss are the first line of treatment for diabetes. Continue with current meds and follow up with Dr. Loanne Keith Calhoun. Check labs today.  - Hemoglobin A1c - Comprehensive metabolic panel  2. Other hyperlipidemia Cardiovascular risk and specific lipid/LDL goals reviewed.  We discussed several lifestyle modifications today and Keith Calhoun will continue to work on diet, exercise and weight loss efforts. Orders and follow up as documented in patient record. Continue current treatment  plan.  Counseling Intensive lifestyle modifications are the first line treatment for this issue. Dietary changes: Increase soluble fiber. Decrease simple carbohydrates. Exercise changes: Moderate to vigorous-intensity aerobic activity 150 minutes per week if tolerated. Lipid-lowering medications: see documented in medical record. Check labs today.  - Lipid panel  3. Vitamin D deficiency Low Vitamin D level contributes to fatigue and are associated with obesity, breast, and colon cancer. He agrees to continue to take prescription Vitamin D 50,000 IU every week and will follow-up for routine testing of Vitamin D, at least 2-3 times per year to avoid over-replacement. Check labs today.  - VITAMIN D 25 Hydroxy (Vit-D Deficiency, Fractures)  Refill- Vitamin D, Ergocalciferol, (DRISDOL) 1.25 MG (50000 UNIT) CAPS capsule; Take 1 capsule (50,000 Units total) by mouth every 7 (seven) days.  Dispense: 4 capsule; Refill: 0  4. Obesity, current BMI 37.02  Keith Calhoun is currently in the action stage of change. As such, his goal is to continue with weight loss efforts. He has agreed to the Category 2 Plan.   Exercise goals:  As is  Behavioral modification strategies: meal planning and cooking strategies and emotional eating strategies.  Keith Calhoun has agreed to follow-up with our clinic in 3 weeks. He was informed of the importance of frequent follow-up visits to maximize his success with intensive lifestyle modifications for his multiple health conditions.   Keith Calhoun was informed we would discuss his lab results at his next visit unless there is a critical issue that needs to be addressed sooner. Keith Calhoun agreed to keep his next visit at the agreed upon time to discuss these results.  Objective:   Blood pressure 104/61, pulse (!) 51, temperature 98.3 F (  36.8 C), temperature source Oral, height 6' (1.829 m), weight 273 lb (123.8 kg), SpO2 98 %. Body mass index is 37.03 kg/m.  General: Cooperative, alert, well  developed, in no acute distress. HEENT: Conjunctivae and lids unremarkable. Cardiovascular: Regular rhythm.  Lungs: Normal work of breathing. Neurologic: No focal deficits.   Lab Results  Component Value Date   CREATININE 1.64 (H) 09/08/2020   BUN 21 09/08/2020   NA 145 (H) 09/08/2020   K 5.3 (H) 09/08/2020   CL 108 (H) 09/08/2020   CO2 22 09/08/2020   Lab Results  Component Value Date   ALT 14 09/08/2020   AST 17 09/08/2020   ALKPHOS 76 09/08/2020   BILITOT 0.3 09/08/2020   Lab Results  Component Value Date   HGBA1C 6.8 (A) 12/01/2020   HGBA1C 10.3 (A) 05/26/2020   HGBA1C 10.9 (A) 03/21/2020   HGBA1C 10.7 (A) 03/17/2020   No results found for: INSULIN Lab Results  Component Value Date   TSH 0.561 08/11/2020   Lab Results  Component Value Date   CHOL 227 (H) 03/17/2020   HDL 46 03/17/2020   LDLCALC 137 (H) 03/17/2020   TRIG 248 (H) 03/17/2020   CHOLHDL 4.9 03/17/2020   Lab Results  Component Value Date   VD25OH 32.4 08/11/2020   Lab Results  Component Value Date   WBC 6.4 03/17/2020   HGB 13.1 03/17/2020   HCT 40.7 03/17/2020   MCV 82 03/17/2020   PLT 314 03/17/2020   No results found for: IRON, TIBC, FERRITIN  Obesity Behavioral Intervention:   Approximately 15 minutes were spent on the discussion below.  ASK: We discussed the diagnosis of obesity with Keith Calhoun today and Keith Calhoun agreed to give [place] permission to discuss obesity behavioral modification therapy today.  ASSESS: Keith Calhoun has the diagnosis of obesity and his BMI today is 37.1. Keith Calhoun is in the action stage of change.   ADVISE: Keith Calhoun was educated on the multiple health risks of obesity as well as the benefit of weight loss to improve his health. He was advised of the need for long term treatment and the importance of lifestyle modifications to improve his current health and to decrease his risk of future health problems.  AGREE: Multiple dietary modification options and treatment options were  discussed and Keith Calhoun agreed to follow the recommendations documented in the above note.  ARRANGE: Keith Calhoun was educated on the importance of frequent visits to treat obesity as outlined per CMS and USPSTF guidelines and agreed to schedule his next follow up appointment today.  Attestation Statements:   Reviewed by clinician on day of visit: allergies, medications, problem list, medical history, surgical history, family history, social history, and previous encounter notes.  Coral Ceo, CMA, am acting as transcriptionist for [employer], PA-C.  I have reviewed the above documentation for accuracy and completeness, and I agree with the above. Abby Potash, PA-C

## 2020-12-19 NOTE — Telephone Encounter (Signed)
Pt is calling to see about getting some advise regarding his headaches/meds. Said the meds arent helping, he isnt sleeping, and has to wear an ice patch. He is having HA every night.

## 2020-12-20 LAB — COMPREHENSIVE METABOLIC PANEL
ALT: 16 IU/L (ref 0–44)
AST: 15 IU/L (ref 0–40)
Albumin/Globulin Ratio: 1.6 (ref 1.2–2.2)
Albumin: 4.4 g/dL (ref 3.8–4.8)
Alkaline Phosphatase: 91 IU/L (ref 44–121)
BUN/Creatinine Ratio: 17 (ref 10–24)
BUN: 30 mg/dL — ABNORMAL HIGH (ref 8–27)
Bilirubin Total: <0.2 mg/dL (ref 0.0–1.2)
CO2: 22 mmol/L (ref 20–29)
Calcium: 8.7 mg/dL (ref 8.6–10.2)
Chloride: 103 mmol/L (ref 96–106)
Creatinine, Ser: 1.78 mg/dL — ABNORMAL HIGH (ref 0.76–1.27)
Globulin, Total: 2.7 g/dL (ref 1.5–4.5)
Glucose: 133 mg/dL — ABNORMAL HIGH (ref 70–99)
Potassium: 4.9 mmol/L (ref 3.5–5.2)
Sodium: 140 mmol/L (ref 134–144)
Total Protein: 7.1 g/dL (ref 6.0–8.5)
eGFR: 42 mL/min/{1.73_m2} — ABNORMAL LOW (ref >59)

## 2020-12-20 LAB — LIPID PANEL
Chol/HDL Ratio: 4.7 ratio (ref 0.0–5.0)
Cholesterol, Total: 198 mg/dL (ref 100–199)
HDL: 42 mg/dL (ref >39)
LDL Chol Calc (NIH): 131 mg/dL — ABNORMAL HIGH (ref 0–99)
Triglycerides: 137 mg/dL (ref 0–149)
VLDL Cholesterol Cal: 25 mg/dL (ref 5–40)

## 2020-12-20 LAB — HEMOGLOBIN A1C
Est. average glucose Bld gHb Est-mCnc: 160 mg/dL
Hgb A1c MFr Bld: 7.2 % — ABNORMAL HIGH (ref 4.8–5.6)

## 2020-12-20 LAB — VITAMIN D 25 HYDROXY (VIT D DEFICIENCY, FRACTURES): Vit D, 25-Hydroxy: 30.1 ng/mL (ref 30.0–100.0)

## 2020-12-20 NOTE — Telephone Encounter (Signed)
Per pt he is not sleeping, he is taking his Nortriptyline and Topiramate at bedtime   Pt states the headaches are so bad he has to sleep with a ice pack. Around 2 am is when the headaches start. Very morning. Pt state he takes an Ibuprofen to go back to sleep. Pt wanted to know if the mixture of the medication or seasonal thing.  Please advise.

## 2020-12-20 NOTE — Telephone Encounter (Signed)
Tried calling, pt no answer. LMOVM to call the office back.

## 2020-12-21 MED ORDER — NORTRIPTYLINE HCL 25 MG PO CAPS: 50.0000 mg | ORAL_CAPSULE | Freq: Every day | ORAL | 2 refills | Status: DC

## 2020-12-21 NOTE — Addendum Note (Signed)
Addended by: Venetia Night on: 12/21/2020 02:10 PM   Modules accepted: Orders

## 2020-12-21 NOTE — Telephone Encounter (Signed)
Pt advised of Dr.Jaffe note below, If he is taking ibuprofen everyday, that is part of the problem.  Limit pain relievers to no more than 2 days out of week.  Is he still taking topiramate (please verify dose).  We can increase nortriptyline to 25 mg at bedtime for one week and then increase to 50 mg at bedtime.

## 2021-01-09 NOTE — Progress Notes (Signed)
NEUROLOGY FOLLOW UP OFFICE NOTE  Keith Calhoun [id number]  Assessment/Plan:   Migraine without aura, without status migrainosus, not intractable, now possibly transformed to cluster headache Essential tremor  Due to worsening headache, will check MRI/MRA of brain to evaluate for secondary intracranial abnormality Also check sed rate and CRP. Migraine prevention:  Continue titration of nortriptyline to 60m at bedtime.  If no improvement in 5 weeks, then I would change from nortriptyline to verapamil; topiramate 1088mat bedtime Tremor management:  primidone 5059mt bedtime, topiramate 100m49m bedtime Migraine rescue:  Zomig 5mg 45mmit use of pain relievers to no more than 2 days out of week to prevent risk of rebound or medication-overuse headache. Keep headache diary Follow up 6 months.   Subjective:  Keith Calhoun [age] old right-handed man with hypertension, type 2 diabetes mellitus, hyperlipidemia, chronic neck and back pain, GERD, and asthma who folllows up for migraine and tremor.   UPDATE: Migraines:  Migraines started increasing at the end of summer.  They occur at 2 in the morning.  They occur every night, involving the right temporal/periorbital region.  There is associated eye lacrimation.  Takes Zomig and lasts one hour.  Laying flat aggravates the pain, so he sleeps upright.  Earlier this month, nortriptyline was increased, currently on 25mg 80ma week, then 50mg a53mdtime  Tremor:  Started primidone in June.  Tremors much improve.  Sometimes noticeable.    Current NSAIDS: Aspirin 325 mg Current analgesics: Acetaminophen Current triptans: zolmitriptan 5mg Cur17mt ergotamine: None Current anti-emetic: None Current muscle relaxants: None Current anti-anxiolytic: None Current sleep aide: None Current Antihypertensive medications: Lisinopril Current Antidepressant medications: Nortriptyline 50 mg at bedtime Current Anticonvulsant medications: topiramate 100mg at 10mtime; gabapentin 300 mg daily Current anti-CGRP: none Vitamins/Herbal/Supplements: Magnesium citrate 400 mg daily, riboflavin 400 mg, co-Q10 100 mg 3 times daily Current Antihistamines/Decongestants: None Other therapy: None     Caffeine: No Diet: Hydrates Exercise:  He is more active at work. Depression: No; Anxiety: Yes, only because of headaches Other pain: History of chronic neck and back pain.  Sleep hygiene: Poor.  Trouble falling asleep and staying asleep, often due to ongoing generalized aches and pain.   HISTORY: Onset: Age 11 or 65 years Location:  Right temporal Quality:  Throbbing/pressure Initial intensity:  8/10.  He denies thunderclap headache or severe headache.  He may wake up with headache (as they often occur at bedtime) but headaches themselves do not wake him up. Aura:  no Prodrome:  no Postdrome:  no Associated symptoms: Nausea, photophobia.  No vomiting, phonophobia or visual disturbance.  He denies associated unilateral numbness or weakness. Initial duration:  30 minutes with Zomig 5mg, othe71mse several hours Initial Frequency:  Twice daily, always at night. Initial Frequency of abortive medication: 5 to 7 days a week Triggers: Anxiety Relieving factors: Ice, massage Activity:  aggravates   Past NSAIDS:  ibuprofen, Celebrex, naproxen 500mg Past 63mgesics:  tramadol, hydrocodone, tramadol Past abortive triptans:  no Past muscle relaxants:  no Past anti-emetic:  no Past antihypertensive medications:  no Past antidepressant medications:  amitriptyline 25mg (took 74mfly for a few days to help with sleep) Past anticonvulsant medications:   none+ Past vitamins/Herbal/Supplements:  no Past antihistamines/decongestants:  no Other past therapies:  no   Family history of headache:  No  PAST MEDICAL HISTORY: Past Medical History:  Diagnosis Date   Allergy    seasonal allergies per pt   Anemia    2012-  turned down for donating blood, due to low  count    Anxiety    claustrophobia    Arthritis    L knee , L shoudler    Bronchitis    Chronic pain    Diabetes mellitus    dx 2007   GERD (gastroesophageal reflux disease)    Headache    ?? 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      albuterol (PROVENTIL HFA;VENTOLIN HFA) 108 (90 Base) MCG/ACT inhaler Inhale 2 puffs every 6 (six) hours as needed into the lungs for wheezing or shortness of breath.      ASPIRIN PO Take by mouth.     cetirizine (ZYRTEC) 10 MG chewable tablet Chew 10 mg by mouth daily.     Cholecalciferol 50 MCG (2000 UT) TBDP Take by mouth.     docusate sodium (COLACE) 50 MG capsule Take 50 mg by mouth 2 (two) times daily.     gabapentin (NEURONTIN) 300 MG capsule Take 300 mg by mouth at bedtime.     glucose blood (ACCU-CHEK GUIDE) test strip 1 each by Other route daily. And lancets 1/day 90 each 3   latanoprost (XALATAN) 0.005 % ophthalmic solution Place 1 drop into both eyes at bedtime.     lisinopril (PRINIVIL,ZESTRIL) 40 MG tablet Take 40 mg by mouth at bedtime.     Magnesium 400 MG TABS Take 400 mg by mouth daily. 30 tablet 5   metFORMIN (GLUCOPHAGE-XR) 750 MG 24 hr tablet Take 1 tablet (750 mg total) by mouth daily. 90 tablet 3   MOVIPREP 100 g SOLR Moviprep as directed, no substitutions 1 kit 0   naproxen (NAPROSYN) 500 MG tablet Take 500 mg by mouth daily as needed.     nortriptyline (PAMELOR) 25 MG capsule Take 2 capsules (50 mg total) by mouth at bedtime. Take 25 mg at bedtime for one week,  then increase to 50 mg at bedtime. 60 capsule 2   oxymetazoline (AFRIN) 0.05 % nasal spray Place 1 spray 2 (two) times daily as  needed into both nostrils for congestion.     pravastatin (PRAVACHOL) 10 MG tablet Take 10 mg by mouth at bedtime.     primidone (MYSOLINE) 50 MG tablet Take 1/2 tablet at bedtime for one week, then increase to 1 tablet at bedtime. 30 tablet 2   repaglinide (PRANDIN) 2 MG tablet Take 1 tablet (2 mg total) by mouth 2 (two) times daily before a meal. 180 tablet 3   Riboflavin 400 MG TABS Take 1 tablet by mouth daily. 90 tablet 3   topiramate (TOPAMAX) 100 MG tablet Take one capsule at bedtime (Patient taking differently: 100 mg. Take one capsule at bedtime) 30 tablet 0   Vitamin D, Ergocalciferol, (DRISDOL) 1.25 MG (50000 UNIT) CAPS capsule Take 1 capsule (50,000 Units total) by mouth every 7 (seven) days. 4 capsule 0   ZOLMitriptan (ZOMIG PO) Take by mouth.     Current Facility-Administered Medications on File Prior to Visit  Medication Dose Route Frequency Provider Last Rate Last Admin   0.9 %  sodium chloride infusion  500 mL Intravenous Once Nandigam, Venia Minks, MD        ALLERGIES: No Known Allergies  FAMILY HISTORY: Family History  Problem Relation Age of Onset   Heart disease Father    Diabetes Father    Hypertension Father    Hyperlipidemia Father    Heart attack Father    Blindness Mother    Diabetes Mother    Anesthesia problems Neg Hx    Hypotension Neg Hx    Malignant hyperthermia Neg Hx    Pseudochol deficiency Neg Hx    Colon cancer Neg Hx    Colon polyps Neg Hx    Esophageal cancer Neg Hx    Rectal cancer Neg Hx    Stomach cancer Neg Hx       Objective:  Blood pressure 127/79, pulse 66, height '6\' 2"'  (1.88 m), weight 280 lb 9.6 oz (127.3 kg), SpO2 97 %. General: No acute distress.  Patient appears well-groomed.   Head:  Normocephalic/atraumatic Eyes:  Fundi examined but not visualized Neck: supple, no paraspinal tenderness, full range of motion Heart:  Regular rate and rhythm Lungs:  Clear to auscultation bilaterally Back: No paraspinal  tenderness Neurological Exam: alert and oriented to person, place, and time.  Speech fluent and not dysarthric, language intact. Postural and kinetic tremor in hands not present on exam.  Gait normal.    Metta Clines, DO  CC: Landry Mellow, MD

## 2021-01-10 ENCOUNTER — Encounter: Payer: Self-pay | Admitting: Neurology

## 2021-01-10 ENCOUNTER — Other Ambulatory Visit: Payer: Self-pay

## 2021-01-10 ENCOUNTER — Ambulatory Visit (INDEPENDENT_AMBULATORY_CARE_PROVIDER_SITE_OTHER): Payer: Medicare Other | Admitting: Neurology

## 2021-01-10 VITALS — BP 127/79 | HR 66 | Ht 74.0 in | Wt 280.6 lb

## 2021-01-10 DIAGNOSIS — G250 Essential tremor: Secondary | ICD-10-CM

## 2021-01-10 DIAGNOSIS — R519 Headache, unspecified: Secondary | ICD-10-CM

## 2021-01-10 DIAGNOSIS — G43009 Migraine without aura, not intractable, without status migrainosus: Secondary | ICD-10-CM | POA: Diagnosis not present

## 2021-01-10 NOTE — Patient Instructions (Signed)
These may be migraines but would also consider cluster headaches Continue nortriptyline - increase from 25 to 50mg  at bedtime on Saturday.  If no improvement by time for refill, let me know Continue topiramate 100mg  at bedtime Continue zolmitriptan as needed for migraine.  Limit use of pain relievers to no more than 2 days out of week to prevent risk of rebound or medication-overuse headache. Continue primidone 50mg  at bedtime MRI of brain without contrast Check sed rate and CRP Follow up in 6 months

## 2021-01-10 NOTE — Addendum Note (Signed)
Addended by: Venetia Night on: 01/10/2021 03:12 PM   Modules accepted: Orders

## 2021-01-10 NOTE — Progress Notes (Signed)
[place] Approval Form filled out and faxed to [facility].

## 2021-01-11 ENCOUNTER — Ambulatory Visit (INDEPENDENT_AMBULATORY_CARE_PROVIDER_SITE_OTHER): Payer: Medicare Other | Admitting: Bariatrics

## 2021-01-19 ENCOUNTER — Ambulatory Visit (INDEPENDENT_AMBULATORY_CARE_PROVIDER_SITE_OTHER): Payer: Medicare Other | Admitting: Bariatrics

## 2021-01-19 ENCOUNTER — Encounter (INDEPENDENT_AMBULATORY_CARE_PROVIDER_SITE_OTHER): Payer: Self-pay | Admitting: Bariatrics

## 2021-01-19 ENCOUNTER — Other Ambulatory Visit: Payer: Self-pay

## 2021-01-19 VITALS — BP 109/69 | HR 78 | Temp 97.9°F | Ht 72.0 in | Wt 271.0 lb

## 2021-01-19 DIAGNOSIS — E7849 Other hyperlipidemia: Secondary | ICD-10-CM

## 2021-01-19 DIAGNOSIS — E559 Vitamin D deficiency, unspecified: Secondary | ICD-10-CM | POA: Diagnosis not present

## 2021-01-19 DIAGNOSIS — E1169 Type 2 diabetes mellitus with other specified complication: Secondary | ICD-10-CM | POA: Diagnosis not present

## 2021-01-19 DIAGNOSIS — Z6841 Body Mass Index (BMI) 40.0 and over, adult: Secondary | ICD-10-CM

## 2021-01-19 MED ORDER — VITAMIN D (ERGOCALCIFEROL) 1.25 MG (50000 UNIT) PO CAPS: 50000.0000 [IU] | ORAL_CAPSULE | ORAL | 0 refills | Status: DC

## 2021-01-23 ENCOUNTER — Encounter (INDEPENDENT_AMBULATORY_CARE_PROVIDER_SITE_OTHER): Payer: Self-pay | Admitting: Bariatrics

## 2021-01-23 NOTE — Progress Notes (Signed)
Chief Complaint:   OBESITY Keith Calhoun is here to discuss his progress with his obesity treatment plan along with follow-up of his obesity related diagnoses. Keith Calhoun is on the Category 2 Plan and states he is following his eating plan approximately 60% of the time. Keith Calhoun states he is walking for 30 minutes 4 times per week.  Today's visit was #: 8 Starting weight: 296 lbs Starting date: 08/11/2020 Today's weight: 271 lbs Today's date: 01/19/2021 Total lbs lost to date: 25 lbs Total lbs lost since last in-office visit: 2 lbs  Interim History: Keith Calhoun is down another 2 lbs since his last visit.   Subjective:   1. Vitamin D deficiency Keith Calhoun is taking his Vitamin D as directed. His last Vitamin D level was 30.1.  2. Other hyperlipidemia Keith Calhoun is currently taking Pravachol.   3. Type 2 diabetes mellitus with other specified complication, without long-term current use of insulin (HCC) Keith Calhoun is taking Metformin and Prandin currently. His last A1C level was 7.2.  Assessment/Plan:   1. Vitamin D deficiency Low Vitamin D level contributes to fatigue and are associated with obesity, breast, and colon cancer. We will refill prescription Vitamin D 50,000 IU every week for 1 month with no refills and he will follow-up for routine testing of Vitamin D, at least 2-3 times per year to avoid over-replacement.  - Vitamin D, Ergocalciferol, (DRISDOL) 1.25 MG (50000 UNIT) CAPS capsule; Take 1 capsule (50,000 Units total) by mouth every 7 (seven) days.  Dispense: 4 capsule; Refill: 0  2. Other hyperlipidemia Cardiovascular risk and specific lipid/LDL goals reviewed.  We discussed several lifestyle modifications today and Delynn will continue his medications and he will continue to work on diet, exercise and weight loss efforts. Orders and follow up as documented in patient record.   Counseling Intensive lifestyle modifications are the first line treatment for this issue. Dietary changes: Increase soluble fiber.  Decrease simple carbohydrates. Exercise changes: Moderate to vigorous-intensity aerobic activity 150 minutes per week if tolerated. Lipid-lowering medications: see documented in medical record.   3. Type 2 diabetes mellitus with other specified complication, without long-term current use of insulin (HCC) Keith Calhoun will continue his medications. Good blood sugar control is important to decrease the likelihood of diabetic complications such as nephropathy, neuropathy, limb loss, blindness, coronary artery disease, and death. Intensive lifestyle modification including diet, exercise and weight loss are the first line of treatment for diabetes.   4. Obesity, current BMI 36.8 Omega is currently in the action stage of change. As such, his goal is to continue with weight loss efforts. He has agreed to the Category 2 Plan.   Keith Calhoun will continue meal planning and he will be mindful eating. We reviewed labs from 12/19/2020 CMP, Lipids, Vitamin D, and  A1C. Strategies for the holidays were provided.  Exercise goals:  As is.  Behavioral modification strategies: increasing lean protein intake, decreasing simple carbohydrates, increasing vegetables, increasing water intake, decreasing eating out, no skipping meals, meal planning and cooking strategies, keeping healthy foods in the home, and planning for success.  Keith Calhoun has agreed to follow-up with our clinic in 3 weeks. He was informed of the importance of frequent follow-up visits to maximize his success with intensive lifestyle modifications for his multiple health conditions.   Objective:   Blood pressure 109/69, pulse 78, temperature 97.9 F (36.6 C), height 6' (1.829 m), weight 271 lb (122.9 kg), SpO2 97 %. Body mass index is 36.75 kg/m.  General: Cooperative, alert, well developed, in  no acute distress. HEENT: Conjunctivae and lids unremarkable. Cardiovascular: Regular rhythm.  Lungs: Normal work of breathing. Neurologic: No focal deficits.   Lab  Results  Component Value Date   CREATININE 1.78 (H) 12/19/2020   BUN 30 (H) 12/19/2020   NA 140 12/19/2020   K 4.9 12/19/2020   CL 103 12/19/2020   CO2 22 12/19/2020   Lab Results  Component Value Date   ALT 16 12/19/2020   AST 15 12/19/2020   ALKPHOS 91 12/19/2020   BILITOT <0.2 12/19/2020   Lab Results  Component Value Date   HGBA1C 7.2 (H) 12/19/2020   HGBA1C 6.8 (A) 12/01/2020   HGBA1C 10.3 (A) 05/26/2020   HGBA1C 10.9 (A) 03/21/2020   HGBA1C 10.7 (A) 03/17/2020   No results found for: INSULIN Lab Results  Component Value Date   TSH 0.561 08/11/2020   Lab Results  Component Value Date   CHOL 198 12/19/2020   HDL 42 12/19/2020   LDLCALC 131 (H) 12/19/2020   TRIG 137 12/19/2020   CHOLHDL 4.7 12/19/2020   Lab Results  Component Value Date   VD25OH 30.1 12/19/2020   VD25OH 32.4 08/11/2020   Lab Results  Component Value Date   WBC 6.4 03/17/2020   HGB 13.1 03/17/2020   HCT 40.7 03/17/2020   MCV 82 03/17/2020   PLT 314 03/17/2020   No results found for: IRON, TIBC, FERRITIN  Attestation Statements:   Reviewed by clinician on day of visit: allergies, medications, problem list, medical history, surgical history, family history, social history, and previous encounter notes.  I, Lizbeth Bark, RMA, am acting as Location manager for [employer], DO.   I have reviewed the above documentation for accuracy and completeness, and I agree with the above. Jearld Lesch, DO

## 2021-02-06 ENCOUNTER — Ambulatory Visit: Payer: Medicare Other | Admitting: Endocrinology

## 2021-02-09 ENCOUNTER — Ambulatory Visit (INDEPENDENT_AMBULATORY_CARE_PROVIDER_SITE_OTHER): Payer: Medicare Other | Admitting: Family Medicine

## 2021-02-09 DIAGNOSIS — Z20822 Contact with and (suspected) exposure to covid-19: Secondary | ICD-10-CM | POA: Diagnosis not present

## 2021-02-21 ENCOUNTER — Telehealth (INDEPENDENT_AMBULATORY_CARE_PROVIDER_SITE_OTHER): Payer: Medicare Other | Admitting: Family Medicine

## 2021-02-21 ENCOUNTER — Encounter (INDEPENDENT_AMBULATORY_CARE_PROVIDER_SITE_OTHER): Payer: Self-pay

## 2021-02-21 ENCOUNTER — Encounter (INDEPENDENT_AMBULATORY_CARE_PROVIDER_SITE_OTHER): Payer: Self-pay | Admitting: Family Medicine

## 2021-02-21 ENCOUNTER — Other Ambulatory Visit: Payer: Self-pay

## 2021-02-21 ENCOUNTER — Telehealth: Payer: Self-pay | Admitting: Neurology

## 2021-02-21 VITALS — Ht 72.0 in

## 2021-02-21 DIAGNOSIS — E559 Vitamin D deficiency, unspecified: Secondary | ICD-10-CM | POA: Diagnosis not present

## 2021-02-21 DIAGNOSIS — Z6841 Body Mass Index (BMI) 40.0 and over, adult: Secondary | ICD-10-CM | POA: Diagnosis not present

## 2021-02-21 MED ORDER — VITAMIN D (ERGOCALCIFEROL) 1.25 MG (50000 UNIT) PO CAPS
50000.0000 [IU] | ORAL_CAPSULE | ORAL | 0 refills | Status: DC
Start: 2021-02-21 — End: 2021-09-13

## 2021-02-21 NOTE — Telephone Encounter (Signed)
Patient advised of his MRI/Mra head.

## 2021-02-21 NOTE — Telephone Encounter (Signed)
Please let patient know that MRI and MRA of brain from 02/16/2021 were unremarkable.  Nothing concerning.

## 2021-02-22 NOTE — Progress Notes (Signed)
TeleHealth Visit:  Due to the COVID-19 pandemic, this visit was completed with telemedicine (audio/video) technology to reduce patient and provider exposure as well as to preserve personal protective equipment.   Keith Calhoun has verbally consented to this TeleHealth visit. The patient is located at home, the provider is located at the [facility] office. The participants in this visit include the listed provider and patient. The visit was conducted today via video.   Chief Complaint: OBESITY Keith Calhoun is here to discuss his progress with his obesity treatment plan along with follow-up of his obesity related diagnoses. Keith Calhoun is on the Category 2 Plan and states he is following his eating plan approximately 60% of the time. Keith Calhoun states he is walking 30 minutes 3 times per week.  Today's visit was #: 9 Starting weight: 296 lbs Starting date: 08/11/2020  Interim History: Keith Calhoun has a runny nose and head congestion; needed live video visit today. His last appt was on 12/19/2020 with Dr. Owens Shark. This is his first visit with me. He has been caring for his brother lately and hasn't cared for himself as well as he'd like to. Pt is walking 30 minutes 3 days a week.  Subjective:   1. Vitamin D deficiency Keith Calhoun is tolerating medication(s) well without side effects.  Medication compliance is good and patient appears to be taking it as prescribed.  Denies additional concerns regarding this condition.   Assessment/Plan:  No orders of the defined types were placed in this encounter.   Medications Discontinued During This Encounter  Medication Reason   Vitamin D, Ergocalciferol, (DRISDOL) 1.25 MG (50000 UNIT) CAPS capsule Reorder     Meds ordered this encounter  Medications   Vitamin D, Ergocalciferol, (DRISDOL) 1.25 MG (50000 UNIT) CAPS capsule    Sig: Take 1 capsule (50,000 Units total) by mouth every 7 (seven) days.    Dispense:  4 capsule    Refill:  0     1. Vitamin D  deficiency Low Vitamin D level contributes to fatigue and are associated with obesity, breast, and colon cancer. He agrees to continue to take prescription Vitamin D 50,000 IU every week and will follow-up for routine testing of Vitamin D, at least 2-3 times per year to avoid over-replacement.  Refill- Vitamin D, Ergocalciferol, (DRISDOL) 1.25 MG (50000 UNIT) CAPS capsule; Take 1 capsule (50,000 Units total) by mouth every 7 (seven) days.  Dispense: 4 capsule; Refill: 0  2. Obesity with current BMI of 36.75  Keith Calhoun is currently in the action stage of change. As such, his goal is to continue with weight loss efforts. He has agreed to the Category 2 Plan.   Mindful eating strategies discussed with pt today. Increase exercise to help with stress. Meal prep and planning discussed with pt as well as holiday strategies.  Exercise goals:  Increase as tolerated to help with stress management.  Behavioral modification strategies: emotional eating strategies and holiday eating strategies .  Keith Calhoun has agreed to follow-up with our clinic in 3-4 weeks with Dr. Owens Shark or Dr. Raliegh Scarlet. He was informed of the importance of frequent follow-up visits to maximize his success with intensive lifestyle modifications for his multiple health conditions.  Objective:   VITALS: Per patient if applicable, see vitals. GENERAL: Alert and in no acute distress. CARDIOPULMONARY: No increased WOB. Speaking in clear sentences.  PSYCH: Pleasant and cooperative. Speech normal rate and rhythm. Affect is appropriate. Insight and judgement are appropriate. Attention is focused, linear, and appropriate.  NEURO: Oriented as arrived to appointment on time with no prompting.   Lab Results  Component Value Date   CREATININE 1.78 (H) 12/19/2020   BUN 30 (H) 12/19/2020   NA 140 12/19/2020   K 4.9 12/19/2020   CL 103 12/19/2020   CO2 22 12/19/2020   Lab Results  Component Value Date   ALT 16 12/19/2020   AST 15 12/19/2020    ALKPHOS 91 12/19/2020   BILITOT <0.2 12/19/2020   Lab Results  Component Value Date   HGBA1C 7.2 (H) 12/19/2020   HGBA1C 6.8 (A) 12/01/2020   HGBA1C 10.3 (A) 05/26/2020   HGBA1C 10.9 (A) 03/21/2020   HGBA1C 10.7 (A) 03/17/2020   No results found for: INSULIN Lab Results  Component Value Date   TSH 0.561 08/11/2020   Lab Results  Component Value Date   CHOL 198 12/19/2020   HDL 42 12/19/2020   LDLCALC 131 (H) 12/19/2020   TRIG 137 12/19/2020   CHOLHDL 4.7 12/19/2020   Lab Results  Component Value Date   VD25OH 30.1 12/19/2020   VD25OH 32.4 08/11/2020   Lab Results  Component Value Date   WBC 6.4 03/17/2020   HGB 13.1 03/17/2020   HCT 40.7 03/17/2020   MCV 82 03/17/2020   PLT 314 03/17/2020   No results found for: IRON, TIBC, FERRITIN  Attestation Statements:   Reviewed by clinician on day of visit: allergies, medications, problem list, medical history, surgical history, family history, social history, and previous encounter notes.  Coral Ceo, CMA, am acting as transcriptionist for [employer], DO.  I have reviewed the above documentation for accuracy and completeness, and I agree with the above. Marjory Sneddon, D.O.  The [place] was signed into law in 2016 which includes the topic of electronic health records.  This provides immediate access to information in MyChart.  This includes consultation notes, operative notes, office notes, lab results and pathology reports.  If you have any questions about what you read please let us know at your next visit so we can discuss your concerns and take corrective action if need be.  We are right here with you.

## 2021-03-01 DIAGNOSIS — J209 Acute bronchitis, unspecified: Secondary | ICD-10-CM | POA: Diagnosis not present

## 2021-03-01 DIAGNOSIS — R051 Acute cough: Secondary | ICD-10-CM | POA: Diagnosis not present

## 2021-03-01 DIAGNOSIS — Z03818 Encounter for observation for suspected exposure to other biological agents ruled out: Secondary | ICD-10-CM | POA: Diagnosis not present

## 2021-03-01 DIAGNOSIS — Z20822 Contact with and (suspected) exposure to covid-19: Secondary | ICD-10-CM | POA: Diagnosis not present

## 2021-03-01 DIAGNOSIS — R0981 Nasal congestion: Secondary | ICD-10-CM | POA: Diagnosis not present

## 2021-03-21 NOTE — Progress Notes (Signed)
[facility] Referral [id number] 11/11/20-06-29-21

## 2021-05-09 ENCOUNTER — Ambulatory Visit (INDEPENDENT_AMBULATORY_CARE_PROVIDER_SITE_OTHER): Payer: Medicare Other | Admitting: Bariatrics

## 2021-05-09 ENCOUNTER — Encounter (INDEPENDENT_AMBULATORY_CARE_PROVIDER_SITE_OTHER): Payer: Self-pay | Admitting: Bariatrics

## 2021-05-09 ENCOUNTER — Other Ambulatory Visit: Payer: Self-pay

## 2021-05-09 VITALS — BP 121/80 | HR 78 | Temp 98.0°F | Ht 72.0 in | Wt 287.0 lb

## 2021-05-09 DIAGNOSIS — E1169 Type 2 diabetes mellitus with other specified complication: Secondary | ICD-10-CM | POA: Diagnosis not present

## 2021-05-09 DIAGNOSIS — E7849 Other hyperlipidemia: Secondary | ICD-10-CM

## 2021-05-09 DIAGNOSIS — Z6839 Body mass index (BMI) 39.0-39.9, adult: Secondary | ICD-10-CM | POA: Diagnosis not present

## 2021-05-09 DIAGNOSIS — E669 Obesity, unspecified: Secondary | ICD-10-CM

## 2021-05-09 DIAGNOSIS — Z7984 Long term (current) use of oral hypoglycemic drugs: Secondary | ICD-10-CM

## 2021-05-09 DIAGNOSIS — Z6841 Body Mass Index (BMI) 40.0 and over, adult: Secondary | ICD-10-CM

## 2021-05-09 NOTE — Progress Notes (Signed)
Chief Complaint:   OBESITY Keith Calhoun is here to discuss his progress with his obesity treatment plan along with follow-up of his obesity related diagnoses. Keith Calhoun is on the Category 2 Plan and states he is following his eating plan approximately 40% of the time. Keith Calhoun states he is doing 0 minutes 0 times per week.  Today's visit was #: 10 Starting weight: 296 lbs Starting date: 08/11/2020 Today's weight: 287 lbs Today's date: 05/09/2021 Total lbs lost to date: 9 lbs Total lbs lost since last in-office visit: 0  Interim History: Keith Calhoun is up 16 lbs since his last visit. He has been eating late and exercising less.   Subjective:   1. Other hyperlipidemia Keith Calhoun is taking Pravachol currently.  2. Type 2 diabetes mellitus with other specified complication, without long-term current use of insulin (HCC) Keith Calhoun is currently taking Metformin.   Assessment/Plan:   1. Other hyperlipidemia Cardiovascular risk and specific lipid/LDL goals reviewed.  Keith Calhoun will continue taking Pravachol. We discussed several lifestyle modifications today and Keith Calhoun will continue to work on diet, exercise and weight loss efforts. Orders and follow up as documented in patient record.   Counseling Intensive lifestyle modifications are the first line treatment for this issue. Dietary changes: Increase soluble fiber. Decrease simple carbohydrates. Exercise changes: Moderate to vigorous-intensity aerobic activity 150 minutes per week if tolerated. Lipid-lowering medications: see documented in medical record.  2. Type 2 diabetes mellitus with other specified complication, without long-term current use of insulin (HCC) Keith Calhoun will continue taking Metformin. Good blood sugar control is important to decrease the likelihood of diabetic complications such as nephropathy, neuropathy, limb loss, blindness, coronary artery disease, and death. Intensive lifestyle modification including diet, exercise and weight loss are the first line of  treatment for diabetes.   3. Obesity with current BMI of 39.0 Keith Calhoun is currently in the action stage of change. As such, his goal is to continue with weight loss efforts. He has agreed to the Category 2 Plan and keeping a food journal and adhering to recommended goals of 1200 calories and 80-90 grams of protein.   Keith Calhoun will continue meal planning and intentional eating. He will have no sodas. He will increase his water and protein intake.   Exercise goals:  Keith Calhoun will do more yard work.   Behavioral modification strategies: increasing lean protein intake, decreasing simple carbohydrates, increasing vegetables, increasing water intake, decreasing eating out, no skipping meals, meal planning and cooking strategies, keeping healthy foods in the home, and planning for success.  Keith Calhoun has agreed to follow-up with our clinic in 2 weeks. He was informed of the importance of frequent follow-up visits to maximize his success with intensive lifestyle modifications for his multiple health conditions.   Objective:   Blood pressure 121/80, pulse 78, temperature 98 F (36.7 C), height 6' (1.829 m), weight 287 lb (130.2 kg), SpO2 97 %. Body mass index is 38.92 kg/m.  General: Cooperative, alert, well developed, in no acute distress. HEENT: Conjunctivae and lids unremarkable. Cardiovascular: Regular rhythm.  Lungs: Normal work of breathing. Neurologic: No focal deficits.   Lab Results  Component Value Date   CREATININE 1.78 (H) 12/19/2020   BUN 30 (H) 12/19/2020   NA 140 12/19/2020   K 4.9 12/19/2020   CL 103 12/19/2020   CO2 22 12/19/2020   Lab Results  Component Value Date   ALT 16 12/19/2020   AST 15 12/19/2020   ALKPHOS 91 12/19/2020   BILITOT <0.2 12/19/2020   Lab Results  Component Value Date   HGBA1C 7.2 (H) 12/19/2020   HGBA1C 6.8 (A) 12/01/2020   HGBA1C 10.3 (A) 05/26/2020   HGBA1C 10.9 (A) 03/21/2020   HGBA1C 10.7 (A) 03/17/2020   No results found for: INSULIN Lab Results   Component Value Date   TSH 0.561 08/11/2020   Lab Results  Component Value Date   CHOL 198 12/19/2020   HDL 42 12/19/2020   LDLCALC 131 (H) 12/19/2020   TRIG 137 12/19/2020   CHOLHDL 4.7 12/19/2020   Lab Results  Component Value Date   VD25OH 30.1 12/19/2020   VD25OH 32.4 08/11/2020   Lab Results  Component Value Date   WBC 6.4 03/17/2020   HGB 13.1 03/17/2020   HCT 40.7 03/17/2020   MCV 82 03/17/2020   PLT 314 03/17/2020   No results found for: IRON, TIBC, FERRITIN  Attestation Statements:   Reviewed by clinician on day of visit: allergies, medications, problem list, medical history, surgical history, family history, social history, and previous encounter notes.  I, Lizbeth Bark, RMA, am acting as Location manager for [employer], DO.  I have reviewed the above documentation for accuracy and completeness, and I agree with the above. Jearld Lesch, DO

## 2021-05-31 ENCOUNTER — Ambulatory Visit (INDEPENDENT_AMBULATORY_CARE_PROVIDER_SITE_OTHER): Payer: Medicare Other | Admitting: Bariatrics

## 2021-06-22 ENCOUNTER — Ambulatory Visit (INDEPENDENT_AMBULATORY_CARE_PROVIDER_SITE_OTHER): Payer: Medicare Other | Admitting: Bariatrics

## 2021-06-27 ENCOUNTER — Ambulatory Visit (INDEPENDENT_AMBULATORY_CARE_PROVIDER_SITE_OTHER): Payer: Medicare Other | Admitting: Endocrinology

## 2021-06-27 ENCOUNTER — Encounter: Payer: Self-pay | Admitting: Endocrinology

## 2021-06-27 VITALS — BP 122/80 | HR 58 | Ht 72.0 in | Wt 286.4 lb

## 2021-06-27 DIAGNOSIS — E669 Obesity, unspecified: Secondary | ICD-10-CM | POA: Diagnosis not present

## 2021-06-27 DIAGNOSIS — N183 Chronic kidney disease, stage 3 unspecified: Secondary | ICD-10-CM | POA: Diagnosis not present

## 2021-06-27 DIAGNOSIS — E1169 Type 2 diabetes mellitus with other specified complication: Secondary | ICD-10-CM

## 2021-06-27 DIAGNOSIS — E1122 Type 2 diabetes mellitus with diabetic chronic kidney disease: Secondary | ICD-10-CM

## 2021-06-27 LAB — POCT GLYCOSYLATED HEMOGLOBIN (HGB A1C): Hemoglobin A1C: 8.6 % — AB (ref 4.0–5.6)

## 2021-06-27 MED ORDER — RYBELSUS 3 MG PO TABS: 3.0000 mg | ORAL_TABLET | Freq: Every day | ORAL | 1 refills | Status: DC

## 2021-06-27 NOTE — Patient Instructions (Addendum)
check your blood sugar once a day.  vary the time of day when you check, between before the 3 meals, and at bedtime.  also check if you have symptoms of your blood sugar being too high or too low.  please keep a record of the readings and bring it to your next appointment here (or you can bring the meter itself).  You can write it on any piece of paper.  please call us sooner if your blood sugar goes below 70, or if you have a lot of readings over 200.   ?I have also resent the prescription to your pharmacy, to add Rybelsus.   ?Please continue the same other medications.   ?You should have an endocrinology follow-up appointment in 3 months.   ?

## 2021-06-27 NOTE — Progress Notes (Signed)
? ?Subjective:  ? ? Patient ID: Keith Calhoun, male    DOB: 1955-04-25, 66 y.o.   MRN: [id number] ? ?HPI ?Pt returns for f/u of diabetes mellitus: ?DM type: 2 ?Dx'ed: 2015 ?Complications: stage 3 CRI ?Therapy: 2 oral meds.   ?DKA: never ?Severe hypoglycemia: never ?Pancreatitis: never ?Pancreatic imaging: never ?SDOH: he does not check cbg's.   ?Other: he has never taken insulin ?Interval history: he takes meds as rx'ed.  pt states he feels well in general.   ?Past Medical History:  ?Diagnosis Date  ? Allergy   ? seasonal allergies per pt  ? Anemia   ? 2012- turned down for donating blood, due to low count   ? Anxiety   ? claustrophobia   ? Arthritis   ? L knee , L shoudler   ? Bronchitis   ? Chronic pain   ? Diabetes mellitus   ? dx 2007  ? GERD (gastroesophageal reflux disease)   ? Headache   ? ?? stress   ? Hemorrhoids   ? Hyperlipidemia   ? Hypertension   ? Neck pain   ? 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 Wears dentures   ? Wears glasses   ? ? ?Past Surgical History:  ?Procedure Laterality Date  ? COLONOSCOPY    ? 5-10 yrs ago per pt  ? HEMORRHOID SURGERY  04/2011  ? HEMORRHOID SURGERY  05/09/2011  ? Procedure: HEMORRHOIDECTOMY;  Surgeon: Imogene Burn. Georgette Dover, MD;  Location: [facility];  Service: General;  Laterality: N/A;  ? JOINT REPLACEMENT    ? knee (L) and hip (R)  ? KNEE SURGERY  1978  ? left  ? MULTIPLE TOOTH EXTRACTIONS    ? RADIOLOGY WITH ANESTHESIA N/A 04/24/2016  ? Procedure: MRI - CERVICAL SPINE WITHOUT CONTRAST AND LUMBAR SPINE WITHOUT CONTRAST;  Surgeon: Medication Radiologist, MD;  Location: [facility];  Service: Radiology;  Laterality: N/A;  ? RADIOLOGY WITH ANESTHESIA Right 01/24/2017  ? Procedure: MRI RIGHT HIP WITHOUT;  Surgeon: Radiologist, Medication, MD;  Location: [facility];  Service: Radiology;  Laterality: Right;  ? RADIOLOGY WITH ANESTHESIA N/A 01/14/2018  ? Procedure: MRI WITH ANESTHESIA OF CERVICAL SPINE WITHOUT CONTRAST;  Surgeon: Radiologist, Medication, MD;  Location: [facility];  Service:  Radiology;  Laterality: N/A;  ? SHOULDER ARTHROSCOPY DISTAL CLAVICLE EXCISION AND OPEN ROTATOR CUFF REPAIR    ? L shoulder   ? TONSILLECTOMY    ? as a child  ? ? ?Social History  ? ?Socioeconomic History  ? Marital status: Divorced  ?  Spouse name: Not on file  ? Number of children: 2  ? Years of education: Not on file  ? Highest education level: Some college, no degree  ?Occupational History  ? Occupation: retired  ?  Employer: [employer]  ?  Comment: FMLA  leave  ?Tobacco Use  ? Smoking status: Former  ?  Types: Cigarettes  ?  Quit date: 03/13/2007  ?  Years since quitting: 14.3  ? Smokeless tobacco: Never  ?Vaping Use  ? Vaping Use: Never used  ?Substance and Sexual Activity  ? Alcohol use: Not Currently  ? Drug use: No  ? Sexual activity: Not on file  ?Other Topics Concern  ? Not on file  ?Social History Narrative  ? Patient is right-handed. He takes care of his mother, she lives with him in a one story house. He is currently in P/T 3 x week. He drinks tea 3 x week.  ?   ?  07/16/18-Pt retired in January 2020  ?   ? ?Social Determinants of Health  ? ?Financial Resource Strain: Not on file  ?Food Insecurity: Not on file  ?Transportation Needs: Not on file  ?Physical Activity: Not on file  ?Stress: Not on file  ?Social Connections: Not on file  ?Intimate Partner Violence: Not on file  ? ? ?Current Outpatient Medications on File Prior to Visit  ?Medication Sig Dispense Refill  ? acetaminophen (TYLENOL) 500 MG tablet Take 1,000 mg by mouth every 6 (six) hours as needed for mild pain or headache.     ? albuterol (PROVENTIL HFA;VENTOLIN HFA) 108 (90 Base) MCG/ACT inhaler Inhale 2 puffs every 6 (six) hours as needed into the lungs for wheezing or shortness of breath.     ? ASPIRIN PO Take by mouth.    ? cetirizine (ZYRTEC) 10 MG chewable tablet Chew 10 mg by mouth daily.    ? Cholecalciferol 50 MCG (2000 UT) TBDP Take by mouth.    ? docusate sodium (COLACE) 50 MG capsule Take 50 mg by mouth 2 (two) times daily.    ? gabapentin  (NEURONTIN) 300 MG capsule Take 300 mg by mouth at bedtime.    ? glucose blood (ACCU-CHEK GUIDE) test strip 1 each by Other route daily. And lancets 1/day 90 each 3  ? latanoprost (XALATAN) 0.005 % ophthalmic solution Place 1 drop into both eyes at bedtime.    ? lisinopril (PRINIVIL,ZESTRIL) 40 MG tablet Take 40 mg by mouth at bedtime.    ? Magnesium 400 MG TABS Take 400 mg by mouth daily. 30 tablet 5  ? metFORMIN (GLUCOPHAGE-XR) 750 MG 24 hr tablet Take 1 tablet (750 mg total) by mouth daily. 90 tablet 3  ? MOVIPREP 100 g SOLR Moviprep as directed, no substitutions 1 kit 0  ? naproxen (NAPROSYN) 500 MG tablet Take 500 mg by mouth daily as needed.    ? nortriptyline (PAMELOR) 25 MG capsule Take 2 capsules (50 mg total) by mouth at bedtime. Take 25 mg at bedtime for one week,  then increase to 50 mg at bedtime. 60 capsule 2  ? oxymetazoline (AFRIN) 0.05 % nasal spray Place 1 spray 2 (two) times daily as needed into both nostrils for congestion.    ? pravastatin (PRAVACHOL) 10 MG tablet Take 10 mg by mouth at bedtime.    ? primidone (MYSOLINE) 50 MG tablet Take 1/2 tablet at bedtime for one week, then increase to 1 tablet at bedtime. 30 tablet 2  ? repaglinide (PRANDIN) 2 MG tablet Take 1 tablet (2 mg total) by mouth 2 (two) times daily before a meal. 180 tablet 3  ? Riboflavin 400 MG TABS Take 1 tablet by mouth daily. 90 tablet 3  ? topiramate (TOPAMAX) 100 MG tablet Take one capsule at bedtime (Patient taking differently: 100 mg. Take one capsule at bedtime) 30 tablet 0  ? Vitamin D, Ergocalciferol, (DRISDOL) 1.25 MG (50000 UNIT) CAPS capsule Take 1 capsule (50,000 Units total) by mouth every 7 (seven) days. 4 capsule 0  ? ZOLMitriptan (ZOMIG PO) Take by mouth.    ? ?Current Facility-Administered Medications on File Prior to Visit  ?Medication Dose Route Frequency Provider Last Rate Last Admin  ? 0.9 %  sodium chloride infusion  500 mL Intravenous Once Nandigam, Venia Minks, MD      ? ? ?No Known Allergies ? ?Family  History  ?Problem Relation Age of Onset  ? Heart disease Father   ? Diabetes Father   ?  Hypertension Father   ? Hyperlipidemia Father   ? Heart attack Father   ? Blindness Mother   ? Diabetes Mother   ? Anesthesia problems Neg Hx   ? Hypotension Neg Hx   ? Malignant hyperthermia Neg Hx   ? Pseudochol deficiency Neg Hx   ? Colon cancer Neg Hx   ? Colon polyps Neg Hx   ? Esophageal cancer Neg Hx   ? Rectal cancer Neg Hx   ? Stomach cancer Neg Hx   ? ? ?BP 122/80 (BP Location: Left Arm, Patient Position: Sitting, Cuff Size: Normal)   Pulse (!) 58   Ht 6' (1.829 m)   Wt 286 lb 6.4 oz (129.9 kg)   SpO2 96%   BMI 38.84 kg/m?  ? ? ?Review of Systems ? ?   ?Objective:  ? Physical Exam ?VITAL SIGNS:  See vs page.   ?GENERAL: no distress.   ? ? ?Lab Results  ?Component Value Date  ? HGBA1C 8.6 (A) 06/27/2021  ? ?   ?Assessment & Plan:  ?Type 2 DM: uncontrolled ? ?Patient Instructions  ?check your blood sugar once a day.  vary the time of day when you check, between before the 3 meals, and at bedtime.  also check if you have symptoms of your blood sugar being too high or too low.  please keep a record of the readings and bring it to your next appointment here (or you can bring the meter itself).  You can write it on any piece of paper.  please call us sooner if your blood sugar goes below 70, or if you have a lot of readings over 200.   ?I have also resent the prescription to your pharmacy, to add Rybelsus.   ?Please continue the same other medications.   ?You should have an endocrinology follow-up appointment in 3 months.   ? ? ?

## 2021-07-31 ENCOUNTER — Ambulatory Visit (INDEPENDENT_AMBULATORY_CARE_PROVIDER_SITE_OTHER): Payer: Medicare Other | Admitting: Bariatrics

## 2021-07-31 NOTE — Progress Notes (Signed)
[facility] referral form filled it, Due for Renewal appt on 08/11/21.  Waiting on Dr. Tomi Likens to return to sign before faxing.

## 2021-08-09 ENCOUNTER — Telehealth: Payer: Self-pay | Admitting: Neurology

## 2021-08-09 NOTE — Telephone Encounter (Signed)
Patient called and we changed his appt to June 9.  He stated he called [facility] about authorization.  Talked to [place] at [phone]

## 2021-08-09 NOTE — Progress Notes (Signed)
Forms filled out and fax off.

## 2021-08-11 ENCOUNTER — Ambulatory Visit: Payer: Non-veteran care | Admitting: Neurology

## 2021-08-14 ENCOUNTER — Ambulatory Visit (INDEPENDENT_AMBULATORY_CARE_PROVIDER_SITE_OTHER): Payer: Medicare Other | Admitting: Bariatrics

## 2021-08-14 ENCOUNTER — Encounter (INDEPENDENT_AMBULATORY_CARE_PROVIDER_SITE_OTHER): Payer: Self-pay | Admitting: Bariatrics

## 2021-08-14 VITALS — BP 129/68 | HR 50 | Temp 97.8°F | Ht 72.0 in | Wt 279.0 lb

## 2021-08-14 DIAGNOSIS — E669 Obesity, unspecified: Secondary | ICD-10-CM

## 2021-08-14 DIAGNOSIS — E785 Hyperlipidemia, unspecified: Secondary | ICD-10-CM

## 2021-08-14 DIAGNOSIS — E1122 Type 2 diabetes mellitus with diabetic chronic kidney disease: Secondary | ICD-10-CM

## 2021-08-14 DIAGNOSIS — E1169 Type 2 diabetes mellitus with other specified complication: Secondary | ICD-10-CM | POA: Diagnosis not present

## 2021-08-14 DIAGNOSIS — N183 Chronic kidney disease, stage 3 unspecified: Secondary | ICD-10-CM

## 2021-08-14 DIAGNOSIS — Z7984 Long term (current) use of oral hypoglycemic drugs: Secondary | ICD-10-CM | POA: Diagnosis not present

## 2021-08-14 DIAGNOSIS — Z6837 Body mass index (BMI) 37.0-37.9, adult: Secondary | ICD-10-CM

## 2021-08-15 NOTE — Telephone Encounter (Signed)
Telephone call to [place] at the [facility], Status of Referral. Per Hinton Dyer they haven't gotten fax please resend it.  Re faxed referral ATTN: Hinton Dyer. [employer]. Will try again later.

## 2021-08-15 NOTE — Progress Notes (Signed)
Chief Complaint:   OBESITY Keith Calhoun is here to discuss his progress with his obesity treatment plan along with follow-up of his obesity related diagnoses. Keith Calhoun is on the Category 2 Plan and keeping a food journal and adhering to recommended goals of 1200 calories and 80-90 grams of protein and states he is following his eating plan approximately 40% of the time. Keith Calhoun states he is cutting grass for 30 minutes 4 times per week.  Today's visit was #: 11 Starting weight: 296 lbs Starting date: 08/11/2020 Today's weight: 279 lbs Today's date: 08/14/2021 Total lbs lost to date: 17 lbs Total lbs lost since last in-office visit: 8 lbs  Interim History: Keith Calhoun is down 8 lbs since his last visit on 05/06/2021. He has been very busy. He has been nibbling, but not full meals.   Subjective:   1. Hyperlipidemia associated with type 2 diabetes mellitus (Keith Calhoun) Keith Calhoun is taking Pravachol currently.   2. Controlled type 2 diabetes mellitus with stage 3 chronic kidney disease, without long-term current use of insulin (Keith Calhoun) Keith Calhoun is currently taking Rybelsus, primidone, and Metformin. His blood pressure is controlled today 129/68. He notes decreased appetite.   Assessment/Plan:   1. Hyperlipidemia associated with type 2 diabetes mellitus (Keith Calhoun) Cardiovascular risk and specific lipid/LDL goals reviewed.  Keith Calhoun will continue his medications. We discussed several lifestyle modifications today and Keith Calhoun will continue to work on diet, exercise and weight loss efforts. Orders and follow up as documented in patient record.   Counseling Intensive lifestyle modifications are the first line treatment for this issue. Dietary changes: Increase soluble fiber. Decrease simple carbohydrates. Exercise changes: Moderate to vigorous-intensity aerobic activity 150 minutes per week if tolerated. Lipid-lowering medications: see documented in medical record.  2. Controlled type 2 diabetes mellitus with stage 3 chronic kidney disease,  without long-term current use of insulin (Keith Calhoun) Keith Calhoun will continue medications. She will keep all carbohydrates low (sweet and starches). Good blood sugar control is important to decrease the likelihood of diabetic complications such as nephropathy, neuropathy, limb loss, blindness, coronary artery disease, and death. Intensive lifestyle modification including diet, exercise and weight loss are the first line of treatment for diabetes.   3. Obesity, Current BMI 37.9 Keith Calhoun is currently in the action stage of change. As such, his goal is to continue with weight loss efforts. He has agreed to the Category 2 Plan and keeping a food journal and adhering to recommended goals of 1200 calories and 80-90 grams of protein.   Keith Calhoun will continue meal planning and he will continue intentional eating.   Exercise goals:  Keith Calhoun will do more walking.   Behavioral modification strategies: increasing lean protein intake, decreasing simple carbohydrates, increasing vegetables, increasing water intake, decreasing eating out, no skipping meals, meal planning and cooking strategies, keeping healthy foods in the home, and planning for success.  Keith Calhoun has agreed to follow-up with our clinic in 3-4 weeks (fasting). He was informed of the importance of frequent follow-up visits to maximize his success with intensive lifestyle modifications for his multiple health conditions.   Objective:   Blood pressure 129/68, pulse (!) 50, temperature 97.8 F (36.6 C), height 6' (1.829 m), weight 279 lb (126.6 kg), SpO2 95 %. Body mass index is 37.84 kg/m.  General: Cooperative, alert, well developed, in no acute distress. HEENT: Conjunctivae and lids unremarkable. Cardiovascular: Regular rhythm.  Lungs: Normal work of breathing. Neurologic: No focal deficits.   Lab Results  Component Value Date   CREATININE 1.78 (H) 12/19/2020  BUN 30 (H) 12/19/2020   NA 140 12/19/2020   K 4.9 12/19/2020   CL 103 12/19/2020   CO2 22  12/19/2020   Lab Results  Component Value Date   ALT 16 12/19/2020   AST 15 12/19/2020   ALKPHOS 91 12/19/2020   BILITOT <0.2 12/19/2020   Lab Results  Component Value Date   HGBA1C 8.6 (A) 06/27/2021   HGBA1C 7.2 (H) 12/19/2020   HGBA1C 6.8 (A) 12/01/2020   HGBA1C 10.3 (A) 05/26/2020   HGBA1C 10.9 (A) 03/21/2020   No results found for: INSULIN Lab Results  Component Value Date   TSH 0.561 08/11/2020   Lab Results  Component Value Date   CHOL 198 12/19/2020   HDL 42 12/19/2020   LDLCALC 131 (H) 12/19/2020   TRIG 137 12/19/2020   CHOLHDL 4.7 12/19/2020   Lab Results  Component Value Date   VD25OH 30.1 12/19/2020   VD25OH 32.4 08/11/2020   Lab Results  Component Value Date   WBC 6.4 03/17/2020   HGB 13.1 03/17/2020   HCT 40.7 03/17/2020   MCV 82 03/17/2020   PLT 314 03/17/2020   No results found for: IRON, TIBC, FERRITIN  Attestation Statements:   Reviewed by clinician on day of visit: allergies, medications, problem list, medical history, surgical history, family history, social history, and previous encounter notes.  I, Lizbeth Bark, RMA, am acting as Location manager for [employer], DO.  I have reviewed the above documentation for accuracy and completeness, and I agree with the above. Jearld Lesch, DO

## 2021-08-18 ENCOUNTER — Ambulatory Visit: Payer: Non-veteran care | Admitting: Neurology

## 2021-08-18 NOTE — Telephone Encounter (Signed)
Tried to called Dana back, No answer lvm.

## 2021-08-21 ENCOUNTER — Other Ambulatory Visit: Payer: Self-pay

## 2021-08-21 ENCOUNTER — Telehealth: Payer: Self-pay

## 2021-08-21 ENCOUNTER — Encounter (INDEPENDENT_AMBULATORY_CARE_PROVIDER_SITE_OTHER): Payer: Self-pay | Admitting: Bariatrics

## 2021-08-21 DIAGNOSIS — E1169 Type 2 diabetes mellitus with other specified complication: Secondary | ICD-10-CM

## 2021-08-21 MED ORDER — REPAGLINIDE 2 MG PO TABS: 2.0000 mg | ORAL_TABLET | Freq: Two times a day (BID) | ORAL | 2 refills | Status: AC

## 2021-08-21 NOTE — Telephone Encounter (Signed)
Patient has follow up with you. Previous Keith Calhoun patient. Please put lab orders in that you would like drawn.

## 2021-08-21 NOTE — Telephone Encounter (Signed)
error 

## 2021-08-21 NOTE — Telephone Encounter (Signed)
Authorization received.   Patient advised. Patient scheduled for 08/28/21

## 2021-08-22 ENCOUNTER — Other Ambulatory Visit: Payer: Self-pay | Admitting: Endocrinology

## 2021-08-22 DIAGNOSIS — E1169 Type 2 diabetes mellitus with other specified complication: Secondary | ICD-10-CM

## 2021-08-25 NOTE — Progress Notes (Unsigned)
Virtual Visit via Video Note  Consent was obtained for video visit:  Yes.   Answered questions that patient had about telehealth interaction:  Yes.   I discussed the limitations, risks, security and privacy concerns of performing an evaluation and management service by telemedicine. I also discussed with the patient that there may be a patient responsible charge related to this service. The patient expressed understanding and agreed to proceed.  Pt location: Home Physician Location: office Name of referring provider:  Landry Mellow, MD I connected with Sharman Cheek at patients initiation/request on 08/28/2021 at  1:30 PM EDT by video enabled telemedicine application and verified that I am speaking with the correct person using two identifiers. Pt MRN:  [id number] Pt DOB:  1955-11-13 Video Participants:  Sharman Cheek  Assessment and Plan:    Migraine without aura, without status migrainosus, not intractable, now possibly transformed to cluster headache Essential tremor   Migraine prevention:  Continue nortriptyline to '50mg'$  at bedtime. Unclear if his fatigue is side effect.  He will continue to monitor; Continue topiramate '100mg'$  at bedtime Tremor management:  primidone '50mg'$  at bedtime, topiramate '100mg'$  at bedtime Migraine rescue:  Zomig '5mg'$   Limit use of pain relievers to no more than 2 days out of week to prevent risk of rebound or medication-overuse headache. Keep headache diary Follow up 6 months.     Subjective:  Keith Calhoun is a 66 year old right-handed man with hypertension, type 2 diabetes mellitus, hyperlipidemia, chronic neck and back pain, GERD, and asthma who folllows up for migraine and tremor.   UPDATE: MRI and MRA of head on 02/16/2021 were unremarkable.  Did not have sed rate and CRP performed.    Migraines:  Migraines started increasing at the end of summer.  They occur at 2 in the morning.  They occur every night, involving the right temporal/periorbital region.   There is associated eye lacrimation.  Takes Zomig and lasts one hour.  Laying flat aggravates the pain, so he sleeps upright.  Earlier this month, nortriptyline was increased, currently on '25mg'$  for a week, then '50mg'$  at bedtime.  Migraines have improved.  He has not had any recent migraines.  However, he notices some dizziness.  It doesn't happen all of the time.     Tremor:  Taking primidone '50mg'$  at bedtime.  Also taking topiramate '100mg'$  at bedtime.  Rarely has tremor.  Maybe only if he is stressed.    Current NSAIDS: Aspirin 325 mg Current analgesics: Acetaminophen Current triptans: zolmitriptan '5mg'$  Current ergotamine: None Current anti-emetic: None Current muscle relaxants: None Current anti-anxiolytic: None Current sleep aide: None Current Antihypertensive medications: Lisinopril Current Antidepressant medications: Nortriptyline 50 mg at bedtime Current Anticonvulsant medications: topiramate '100mg'$  at bedtime; gabapentin 300 mg daily, primidone '50mg'$  at bedtime (tremor) Current anti-CGRP: none Vitamins/Herbal/Supplements: Magnesium citrate 400 mg daily, riboflavin 400 mg, co-Q10 100 mg 3 times daily Current Antihistamines/Decongestants: None Other therapy: None     Caffeine: No Diet: Hydrates Exercise:  He is more active at work. Depression: No; Anxiety: Yes, only because of headaches Other pain: History of chronic neck and back pain.  Sleep hygiene: Poor.  Trouble falling asleep and staying asleep, often due to ongoing generalized aches and pain.    Reports that his body feels fatigued.  Will take a cat nap in the afternoon.  Feels well.  Not sure if related to increasing the nortriptyline.  Still able to sleep at night.  Feels refreshed during the day.  HISTORY: Onset: Age 38 or 66 years old Location:  Right temporal Quality:  Throbbing/pressure Initial intensity:  8/10.  He denies thunderclap headache or severe headache.  He may wake up with headache (as they often occur at  bedtime) but headaches themselves do not wake him up. Aura:  no Prodrome:  no Postdrome:  no Associated symptoms: Nausea, photophobia.  No vomiting, phonophobia or visual disturbance.  He denies associated unilateral numbness or weakness. Initial duration:  30 minutes with Zomig '5mg'$ , otherwise several hours Initial Frequency:  Twice daily, always at night. Initial Frequency of abortive medication: 5 to 7 days a week Triggers: Anxiety Relieving factors: Ice, massage Activity:  aggravates   Past NSAIDS:  ibuprofen, Celebrex, naproxen '500mg'$  Past analgesics:  tramadol, hydrocodone, tramadol Past abortive triptans:  no Past muscle relaxants:  no Past anti-emetic:  no Past antihypertensive medications:  no Past antidepressant medications:  amitriptyline '25mg'$  (took briefly for a few days to help with sleep) Past anticonvulsant medications:   none+ Past vitamins/Herbal/Supplements:  no Past antihistamines/decongestants:  no Other past therapies:  no   Family history of headache:  No  Past Medical History: Past Medical History:  Diagnosis Date   Allergy    seasonal allergies per pt   Anemia    2012- turned down for donating blood, due to low count    Anxiety    claustrophobia    Arthritis    L knee , L shoudler    Bronchitis    Chronic pain    Diabetes mellitus    dx 2007   GERD (gastroesophageal reflux disease)    Headache    ?? 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    albuterol (PROVENTIL HFA;VENTOLIN HFA) 108 (90 Base) MCG/ACT inhaler Inhale 2 puffs every 6 (six) hours as needed into the lungs for wheezing or shortness of breath.    ASPIRIN PO Take  by mouth.   cetirizine (ZYRTEC) 10 MG chewable tablet Chew 10 mg by mouth daily.   Cholecalciferol 50 MCG (2000 UT) TBDP Take by mouth.   docusate sodium (COLACE) 50 MG capsule Take 50 mg by mouth 2 (two) times daily.   gabapentin (NEURONTIN) 300 MG capsule Take 300 mg by mouth at bedtime.   glucose blood (ACCU-CHEK GUIDE) test strip 1 each by Other route daily. And lancets 1/day   latanoprost (XALATAN) 0.005 % ophthalmic solution Place 1 drop into both eyes at bedtime.   lisinopril (PRINIVIL,ZESTRIL) 40 MG tablet Take 40 mg by mouth at bedtime.   Magnesium 400 MG TABS Take 400 mg by mouth daily.   metFORMIN (GLUCOPHAGE-XR) 750 MG 24 hr tablet Take 1 tablet (750 mg total) by mouth daily.   MOVIPREP 100 g SOLR Moviprep as directed, no substitutions   naproxen (NAPROSYN) 500 MG tablet Take 500 mg by mouth daily as needed.   nortriptyline (PAMELOR) 25 MG capsule Take 2 capsules (50 mg total) by mouth at bedtime. Take 25 mg at bedtime for one week,  then increase to 50 mg at bedtime.   oxymetazoline (AFRIN) 0.05 % nasal spray Place 1 spray  2 (two) times daily as needed into both nostrils for congestion.   pravastatin (PRAVACHOL) 10 MG tablet Take 10 mg by mouth at bedtime.   primidone (MYSOLINE) 50 MG tablet Take 1/2 tablet at bedtime for one week, then increase to 1 tablet at bedtime.   repaglinide (PRANDIN) 2 MG tablet Take 1 tablet (2 mg total) by mouth 2 (two) times daily before a meal.   Riboflavin 400 MG TABS Take 1 tablet by mouth daily.   Semaglutide (RYBELSUS) 3 MG TABS Take 3 mg by mouth daily.   topiramate (TOPAMAX) 100 MG tablet Take one capsule at bedtime (Patient taking differently: 100 mg. Take one capsule at bedtime)   Vitamin D, Ergocalciferol, (DRISDOL) 1.25 MG (50000 UNIT) CAPS capsule Take 1 capsule (50,000 Units total) by mouth every 7 (seven) days.   ZOLMitriptan (ZOMIG PO) Take by mouth.   Facility-Administered Encounter Medications as of 08/28/2021  Medication   0.9 %   sodium chloride infusion    Allergies: No Known Allergies  Family History: Family History  Problem Relation Age of Onset   Heart disease Father    Diabetes Father    Hypertension Father    Hyperlipidemia Father    Heart attack Father    Blindness Mother    Diabetes Mother    Anesthesia problems Neg Hx    Hypotension Neg Hx    Malignant hyperthermia Neg Hx    Pseudochol deficiency Neg Hx    Colon cancer Neg Hx    Colon polyps Neg Hx    Esophageal cancer Neg Hx    Rectal cancer Neg Hx    Stomach cancer Neg Hx     Observations/Objective:   There were no vitals taken for this visit. No acute distress.  Alert and oriented.  Speech fluent and not dysarthric.  Language intact.    Follow Up Instructions:    -I discussed the assessment and treatment plan with the patient. The patient was provided an opportunity to ask questions and all were answered. The patient agreed with the plan and demonstrated an understanding of the instructions.   The patient was advised to call back or seek an in-person evaluation if the symptoms worsen or if the condition fails to improve as anticipated.   Dudley Major, DO

## 2021-08-28 ENCOUNTER — Encounter: Payer: Self-pay | Admitting: Neurology

## 2021-08-28 ENCOUNTER — Telehealth (INDEPENDENT_AMBULATORY_CARE_PROVIDER_SITE_OTHER): Payer: Medicare Other | Admitting: Neurology

## 2021-08-28 DIAGNOSIS — G250 Essential tremor: Secondary | ICD-10-CM

## 2021-08-28 DIAGNOSIS — G43009 Migraine without aura, not intractable, without status migrainosus: Secondary | ICD-10-CM

## 2021-09-13 ENCOUNTER — Ambulatory Visit (INDEPENDENT_AMBULATORY_CARE_PROVIDER_SITE_OTHER): Payer: Medicare Other | Admitting: Bariatrics

## 2021-09-13 ENCOUNTER — Encounter (INDEPENDENT_AMBULATORY_CARE_PROVIDER_SITE_OTHER): Payer: Self-pay | Admitting: Bariatrics

## 2021-09-13 VITALS — BP 118/72 | HR 49 | Temp 97.7°F | Ht 72.0 in | Wt 275.0 lb

## 2021-09-13 DIAGNOSIS — I10 Essential (primary) hypertension: Secondary | ICD-10-CM | POA: Diagnosis not present

## 2021-09-13 DIAGNOSIS — E1169 Type 2 diabetes mellitus with other specified complication: Secondary | ICD-10-CM

## 2021-09-13 DIAGNOSIS — E559 Vitamin D deficiency, unspecified: Secondary | ICD-10-CM

## 2021-09-13 DIAGNOSIS — E669 Obesity, unspecified: Secondary | ICD-10-CM

## 2021-09-13 DIAGNOSIS — Z6837 Body mass index (BMI) 37.0-37.9, adult: Secondary | ICD-10-CM

## 2021-09-13 DIAGNOSIS — E785 Hyperlipidemia, unspecified: Secondary | ICD-10-CM

## 2021-09-13 MED ORDER — VITAMIN D (ERGOCALCIFEROL) 1.25 MG (50000 UNIT) PO CAPS
50000.0000 [IU] | ORAL_CAPSULE | ORAL | 0 refills | Status: DC
Start: 2021-09-13 — End: 2021-11-30

## 2021-09-13 NOTE — Progress Notes (Unsigned)
Chief Complaint:   OBESITY Keith Calhoun is here to discuss his progress with his obesity treatment plan along with follow-up of his obesity related diagnoses. [name] is on the Category 2 Plan or keeping a food journal and adhering to recommended goals of 1200 calories and 80-90 grams of protein daily and states he is following his eating plan approximately 60% of the time. Keith Calhoun states he is walking for 30 minutes 4 times per week.  Today's visit was #: 12 Starting weight: 296 lbs Starting date: 08/11/2020 Today's weight: 275 lbs Today's date: 09/13/2021 Total lbs lost to date: 21 Total lbs lost since last in-office visit: 4  Interim History: Keith Calhoun is down 4 pounds since his last visit, and he is doing well overall.  He is doing better with his water and protein intake.  Subjective:   1. Hyperlipidemia associated with type 2 diabetes mellitus ([place]) Keith Calhoun is taking Pravachol currently.  2. Essential hypertension Mitch's blood pressure is controlled today.  3. Vitamin D deficiency Keith Calhoun is taking prescription vitamin D once weekly.  Assessment/Plan:   1. Hyperlipidemia associated with type 2 diabetes mellitus ([place]) Elio will continue his medications as directed.  2. Essential hypertension Keith Calhoun will continue his medications as directed.  We will follow-up at his next visit.  3. Vitamin D deficiency Keith Calhoun will continue prescription vitamin D once weekly, and we will refill for 1 month.  - Vitamin D, Ergocalciferol, (DRISDOL) 1.25 MG (50000 UNIT) CAPS capsule; Take 1 capsule (50,000 Units total) by mouth every 7 (seven) days.  Dispense: 5 capsule; Refill: 0  4. Obesity, Current BMI 37.3 [name] is currently in the action stage of change. As such, his goal is to continue with weight loss efforts. He has agreed to the Category 2 Plan or keeping a food journal and adhering to recommended goals of 1200 calories and 80-90 grams of protein daily.   Meal planning and intentional eating were discussed.   Low carbohydrate snack ideas were given.  Exercise goals: As is.   Behavioral modification strategies: increasing lean protein intake, decreasing simple carbohydrates, increasing vegetables, increasing water intake, decreasing eating out, no skipping meals, meal planning and cooking strategies, keeping healthy foods in the home, and planning for success.  Keith Calhoun has agreed to follow-up with our clinic in 4 weeks. He was informed of the importance of frequent follow-up visits to maximize his success with intensive lifestyle modifications for his multiple health conditions.   Objective:   Blood pressure 118/72, pulse (!) 49, temperature 97.7 F (36.5 C), height 6' (1.829 m), weight 275 lb (124.7 kg), SpO2 97 %. Body mass index is 37.3 kg/m.  General: Cooperative, alert, well developed, in no acute distress. HEENT: Conjunctivae and lids unremarkable. Cardiovascular: Regular rhythm.  Lungs: Normal work of breathing. Neurologic: No focal deficits.   Lab Results  Component Value Date   CREATININE 1.78 (H) 12/19/2020   BUN 30 (H) 12/19/2020   NA 140 12/19/2020   K 4.9 12/19/2020   CL 103 12/19/2020   CO2 22 12/19/2020   Lab Results  Component Value Date   ALT 16 12/19/2020   AST 15 12/19/2020   ALKPHOS 91 12/19/2020   BILITOT <0.2 12/19/2020   Lab Results  Component Value Date   HGBA1C 8.6 (A) 06/27/2021   HGBA1C 7.2 (H) 12/19/2020   HGBA1C 6.8 (A) 12/01/2020   HGBA1C 10.3 (A) 05/26/2020   HGBA1C 10.9 (A) 03/21/2020   No results found for: "INSULIN" Lab Results  Component Value  Date   TSH 0.561 08/11/2020   Lab Results  Component Value Date   CHOL 198 12/19/2020   HDL 42 12/19/2020   LDLCALC 131 (H) 12/19/2020   TRIG 137 12/19/2020   CHOLHDL 4.7 12/19/2020   Lab Results  Component Value Date   VD25OH 30.1 12/19/2020   VD25OH 32.4 08/11/2020   Lab Results  Component Value Date   WBC 6.4 03/17/2020   HGB 13.1 03/17/2020   HCT 40.7 03/17/2020   MCV 82  03/17/2020   PLT 314 03/17/2020   No results found for: "IRON", "TIBC", "FERRITIN"  Attestation Statements:   Reviewed by clinician on day of visit: allergies, medications, problem list, medical history, surgical history, family history, social history, and previous encounter notes.   Wilhemena Durie, am acting as Location manager for [employer], DO.  I have reviewed the above documentation for accuracy and completeness, and I agree with the above. Jearld Lesch, DO

## 2021-09-14 ENCOUNTER — Encounter (INDEPENDENT_AMBULATORY_CARE_PROVIDER_SITE_OTHER): Payer: Self-pay | Admitting: Bariatrics

## 2021-09-28 ENCOUNTER — Ambulatory Visit (INDEPENDENT_AMBULATORY_CARE_PROVIDER_SITE_OTHER): Payer: Medicare Other | Admitting: Nurse Practitioner

## 2021-09-28 VITALS — BP 121/75 | HR 77 | Temp 97.5°F | Ht 74.0 in | Wt 275.0 lb

## 2021-09-28 DIAGNOSIS — U071 COVID-19: Secondary | ICD-10-CM | POA: Insufficient documentation

## 2021-09-28 MED ORDER — NIRMATRELVIR/RITONAVIR (PAXLOVID) TABLET (RENAL DOSING): 2.0000 | ORAL_TABLET | Freq: Two times a day (BID) | ORAL | 0 refills | Status: DC

## 2021-09-28 NOTE — Progress Notes (Cosign Needed)
   Acute Office Visit  Subjective:     Patient ID: Keith Calhoun, male    DOB: [birth year], 66 y.o.   MRN: [id number]  Chief Complaint  Patient presents with   Nasal Congestion   Cough    C/O cough, runny nose, and chest congestion.   Keith Calhoun is a 66 y/o male who presents today with c/o cough, runny nose, and chest congestion for about 4 days. This is a new problem. Episode onset: about 4 day ago. The problem has been gradually worsening. There has been no fever (Felt feverish at the begining of onset, but). The fever has been present for 1 to 2 days. Associated symptoms include congestion, coughing and rhinorrhea. Associated symptoms comments: Loss of taste and smell. Treatments tried: OTC mucinex, zyrtec, and theraflu. The treatment provided no relief. He reports he has received a total of 5 covid-19 vaccines. Denies any recent travels, but does acknowledge being around large crowds of people.    Review of Systems  Constitutional:  Negative for chills, fever, malaise/fatigue and weight loss.  HENT:  Positive for congestion.   Respiratory:  Positive for cough.   Cardiovascular: Negative.         Objective:    BP 121/75   Pulse 77   Temp (!) 97.5 F (36.4 C)   Ht '6\' 2"'$  (1.88 m)   Wt 275 lb (124.7 kg)   SpO2 98%   BMI 35.31 kg/m    Physical Exam Constitutional:      Appearance: Normal appearance. He is obese.  HENT:     Nose: Congestion and rhinorrhea present.  Cardiovascular:     Rate and Rhythm: Normal rate and regular rhythm.  Pulmonary:     Effort: Pulmonary effort is normal.     Breath sounds: Normal breath sounds.  Abdominal:     General: Bowel sounds are normal.  Musculoskeletal:     Cervical back: Normal range of motion.  Neurological:     Mental Status: He is alert.     No results found for any visits on 09/28/21.      Assessment & Plan:   Problem List Items Addressed This Visit       Other   COVID-19 - Primary    Meds ordered this encounter   Medications   nirmatrelvir/ritonavir EUA, renal dosing, (PAXLOVID) 10 x 150 MG & 10 x '100MG'$  TABS    Sig: Take 2 tablets by mouth 2 (two) times daily for 5 days. (Take nirmatrelvir 150 mg one tablet twice daily for 5 days and ritonavir 100 mg one tablet twice daily for 5 days) Patient GFR is 42    Dispense:  20 tablet    Refill:  0    Order Specific Question:   Supervising Provider    Answer:   Keith Calhoun [[id number]]  -Renal dose prescribed due to elevated abnormalities and last labs  Return if symptoms worsen or fail to improve.  Charleen Kirks, FNP

## 2021-10-12 ENCOUNTER — Encounter: Payer: Self-pay | Admitting: Nurse Practitioner

## 2021-10-18 ENCOUNTER — Encounter (INDEPENDENT_AMBULATORY_CARE_PROVIDER_SITE_OTHER): Payer: Self-pay

## 2021-10-18 ENCOUNTER — Ambulatory Visit (INDEPENDENT_AMBULATORY_CARE_PROVIDER_SITE_OTHER): Payer: Medicare Other | Admitting: Bariatrics

## 2021-11-06 ENCOUNTER — Other Ambulatory Visit (INDEPENDENT_AMBULATORY_CARE_PROVIDER_SITE_OTHER): Payer: No Typology Code available for payment source

## 2021-11-06 DIAGNOSIS — E1169 Type 2 diabetes mellitus with other specified complication: Secondary | ICD-10-CM

## 2021-11-06 DIAGNOSIS — E669 Obesity, unspecified: Secondary | ICD-10-CM

## 2021-11-06 LAB — COMPREHENSIVE METABOLIC PANEL
ALT: 11 U/L (ref 0–53)
AST: 15 U/L (ref 0–37)
Albumin: 4 g/dL (ref 3.5–5.2)
Alkaline Phosphatase: 62 U/L (ref 39–117)
BUN: 30 mg/dL — ABNORMAL HIGH (ref 6–23)
CO2: 24 mEq/L (ref 19–32)
Calcium: 8.8 mg/dL (ref 8.4–10.5)
Chloride: 105 mEq/L (ref 96–112)
Creatinine, Ser: 1.56 mg/dL — ABNORMAL HIGH (ref 0.40–1.50)
GFR: 45.67 mL/min — ABNORMAL LOW (ref >60.00)
Glucose, Bld: 161 mg/dL — ABNORMAL HIGH (ref 70–99)
Potassium: 4.6 mEq/L (ref 3.5–5.1)
Sodium: 138 mEq/L (ref 135–145)
Total Bilirubin: 0.3 mg/dL (ref 0.2–1.2)
Total Protein: 6.9 g/dL (ref 6.0–8.3)

## 2021-11-06 LAB — HEMOGLOBIN A1C: Hgb A1c MFr Bld: 7.5 % — ABNORMAL HIGH (ref 4.6–6.5)

## 2021-11-06 LAB — MICROALBUMIN / CREATININE URINE RATIO
Creatinine,U: 119.5 mg/dL
Microalb Creat Ratio: 0.6 mg/g (ref 0.0–30.0)
Microalb, Ur: <0.7 mg/dL (ref 0.0–1.9)

## 2021-11-08 ENCOUNTER — Encounter: Payer: Self-pay | Admitting: Endocrinology

## 2021-11-08 ENCOUNTER — Ambulatory Visit (INDEPENDENT_AMBULATORY_CARE_PROVIDER_SITE_OTHER): Payer: Medicare Other | Admitting: Endocrinology

## 2021-11-08 ENCOUNTER — Other Ambulatory Visit: Payer: Self-pay

## 2021-11-08 VITALS — BP 122/82 | HR 53 | Ht 74.0 in | Wt 287.6 lb

## 2021-11-08 DIAGNOSIS — I129 Hypertensive chronic kidney disease with stage 1 through stage 4 chronic kidney disease, or unspecified chronic kidney disease: Secondary | ICD-10-CM

## 2021-11-08 DIAGNOSIS — E1165 Type 2 diabetes mellitus with hyperglycemia: Secondary | ICD-10-CM

## 2021-11-08 DIAGNOSIS — E669 Obesity, unspecified: Secondary | ICD-10-CM

## 2021-11-08 DIAGNOSIS — E782 Mixed hyperlipidemia: Secondary | ICD-10-CM

## 2021-11-08 DIAGNOSIS — E1122 Type 2 diabetes mellitus with diabetic chronic kidney disease: Secondary | ICD-10-CM | POA: Diagnosis not present

## 2021-11-08 DIAGNOSIS — N183 Chronic kidney disease, stage 3 unspecified: Secondary | ICD-10-CM

## 2021-11-08 DIAGNOSIS — E1169 Type 2 diabetes mellitus with other specified complication: Secondary | ICD-10-CM | POA: Diagnosis not present

## 2021-11-08 DIAGNOSIS — R7989 Other specified abnormal findings of blood chemistry: Secondary | ICD-10-CM | POA: Diagnosis not present

## 2021-11-08 LAB — LIPID PANEL
Cholesterol: 199 mg/dL (ref 0–200)
HDL: 44.7 mg/dL (ref >39.00)
LDL Cholesterol: 122 mg/dL — ABNORMAL HIGH (ref 0–99)
NonHDL: 154.7
Total CHOL/HDL Ratio: 4
Triglycerides: 162 mg/dL — ABNORMAL HIGH (ref 0.0–149.0)
VLDL: 32.4 mg/dL (ref 0.0–40.0)

## 2021-11-08 LAB — LUTEINIZING HORMONE: LH: 6.81 m[IU]/mL (ref 1.50–9.30)

## 2021-11-08 MED ORDER — ACCU-CHEK GUIDE VI STRP: 1.0000 | ORAL_STRIP | Freq: Every day | 3 refills | Status: AC

## 2021-11-08 MED ORDER — EMPAGLIFLOZIN 10 MG PO TABS: 10.0000 mg | ORAL_TABLET | Freq: Every day | ORAL | 3 refills | Status: AC

## 2021-11-08 NOTE — Patient Instructions (Addendum)
Check blood sugars on waking up 3 days a week  Also check blood sugars about 2 hours after meals and do this after different meals by rotation  Recommended blood sugar levels on waking up are 90-130 and about 2 hours after meal is 130-160  Please bring your blood sugar monitor to each visit, thank you  When starting the JARDIANCE 1 tablet in the morning stop the metformin and reduce LISINOPRIL to half a tablet  Make sure you take Rybelsus only with water 30 minutes before breakfast  Take REPAGLINIDE a few minutes before breakfast and before dinner  Regular walking for exercise

## 2021-11-08 NOTE — Progress Notes (Signed)
Patient ID: Keith Calhoun, male   DOB: 15-May-1955, 66 y.o.   MRN: [id number]           Reason for Appointment: Type II Diabetes follow-up   History of Present Illness   Diagnosis date: 2015  Previous history:  Non-insulin hypoglycemic drugs previously used: Metformin, Amaryl, Rybelsus, Prandin Rybelsus was started in 4/23 Insulin was never used  A1c range in the last few years is: 6.8-10.9  Recent history:     Non-insulin hypoglycemic drugs: Metformin ER 750 mg daily, Prandin 2 mg twice daily, Rybelsus 3 mg daily            Side effects from medications: None  Current self management, blood sugar patterns and problems identified:  A1c is 7.5 compared to 8.6 previously At that time Rybelsus was started He says he does not check his blood sugars as he is not motivated Lab fasting glucose 161 Although he has been on Rybelsus for several months he has been only on 3 mg Also this was not taken consistently in July when he was having decreased appetite with COVID Currently he says that he is taking the Rybelsus with milk before breakfast not water He does tend to help him have more satiety but he has not lost any weight He is eating mostly 2 meals a day with only cereal for breakfast, main meal in the early afternoon and light meal in the evening However he takes his Prandin at breakfast and evening meal Does not have any test trips available at home to check his sugars  Exercise: None Diet management: Does not follow any meal plan     Hypoglycemia:  none    Glucometer: Accu-Chek, no readings available   Dietician visit: Most recent:    None  Weight control:  Wt Readings from Last 3 Encounters:  11/08/21 287 lb 9.6 oz (130.5 kg)  09/28/21 275 lb (124.7 kg)  09/13/21 275 lb (124.7 kg)            Diabetes labs:  Lab Results  Component Value Date   HGBA1C 7.5 (H) 11/06/2021   HGBA1C 8.6 (A) 06/27/2021   HGBA1C 7.2 (H) 12/19/2020   Lab Results  Component Value  Date   MICROALBUR <0.7 11/06/2021   LDLCALC 122 (H) 11/08/2021   CREATININE 1.56 (H) 11/06/2021    No results found for: "FRUCTOSAMINE"   Allergies as of 11/08/2021   No Known Allergies      Medication List        Accurate as of November 08, 2021  4:28 PM. If you have any questions, ask your nurse or doctor.          Accu-Chek Guide test strip Generic drug: glucose blood 1 each by Other route daily. And lancets 1/day   acetaminophen 500 MG tablet Commonly known as: TYLENOL Take 1,000 mg by mouth every 6 (six) hours as needed for mild pain or headache.   albuterol 108 (90 Base) MCG/ACT inhaler Commonly known as: VENTOLIN HFA Inhale 2 puffs every 6 (six) hours as needed into the lungs for wheezing or shortness of breath.   ASPIRIN PO Take 81 mg by mouth.   cetirizine 10 MG chewable tablet Commonly known as: ZYRTEC Chew 10 mg by mouth daily.   Cholecalciferol 50 MCG (2000 UT) Tbdp Take by mouth.   docusate sodium 50 MG capsule Commonly known as: COLACE Take 50 mg by mouth 2 (two) times daily.   empagliflozin 10 MG Tabs tablet Commonly known  as: Jardiance Take 1 tablet (10 mg total) by mouth daily with breakfast. Started by: Elayne Snare, MD   gabapentin 300 MG capsule Commonly known as: NEURONTIN Take 300 mg by mouth at bedtime.   latanoprost 0.005 % ophthalmic solution Commonly known as: XALATAN Place 1 drop into both eyes at bedtime.   lisinopril 40 MG tablet Commonly known as: ZESTRIL Take 40 mg by mouth at bedtime.   Magnesium 400 MG Tabs Take 400 mg by mouth daily.   metFORMIN 750 MG 24 hr tablet Commonly known as: GLUCOPHAGE-XR Take 1 tablet (750 mg total) by mouth daily.   MoviPrep 100 g Solr Generic drug: peg 3350 powder Moviprep as directed, no substitutions   naproxen 500 MG tablet Commonly known as: NAPROSYN Take 500 mg by mouth daily as needed.   nirmatrelvir/ritonavir EUA (renal dosing) 10 x 150 MG & 10 x '100MG'$  Tabs Commonly  known as: PAXLOVID Take 2 tablets by mouth 2 (two) times daily for 5 days. (Take nirmatrelvir 150 mg one tablet twice daily for 5 days and ritonavir 100 mg one tablet twice daily for 5 days) Patient GFR is 42   nortriptyline 25 MG capsule Commonly known as: PAMELOR Take 2 capsules (50 mg total) by mouth at bedtime. Take 25 mg at bedtime for one week,  then increase to 50 mg at bedtime.   oxymetazoline 0.05 % nasal spray Commonly known as: AFRIN Place 1 spray 2 (two) times daily as needed into both nostrils for congestion.   pravastatin 10 MG tablet Commonly known as: PRAVACHOL Take 10 mg by mouth at bedtime.   primidone 50 MG tablet Commonly known as: MYSOLINE Take 1/2 tablet at bedtime for one week, then increase to 1 tablet at bedtime.   repaglinide 2 MG tablet Commonly known as: PRANDIN Take 1 tablet (2 mg total) by mouth 2 (two) times daily before a meal.   Riboflavin 400 MG Tabs Take 1 tablet by mouth daily.   Rybelsus 3 MG Tabs Generic drug: Semaglutide Take 3 mg by mouth daily.   topiramate 100 MG tablet Commonly known as: TOPAMAX Take one capsule at bedtime What changed: how much to take   Vitamin D (Ergocalciferol) 1.25 MG (50000 UNIT) Caps capsule Commonly known as: DRISDOL Take 1 capsule (50,000 Units total) by mouth every 7 (seven) days.   ZOMIG PO Take by mouth.        Allergies: No Known Allergies  Past Medical History:  Diagnosis Date   Allergy    seasonal allergies per pt   Anemia    2012- turned down for donating blood, due to low count    Anxiety    claustrophobia    Arthritis    L knee , L shoudler    Bronchitis    Chronic pain    Diabetes mellitus    dx 2007   GERD (gastroesophageal reflux disease)    Headache    ?? 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 Georgette Dover, MD;  Location: [facility];  Service: General;  Laterality: N/A;  JOINT REPLACEMENT     knee (L) and hip (R)   KNEE SURGERY  1978   left   MULTIPLE TOOTH EXTRACTIONS     RADIOLOGY WITH ANESTHESIA N/A 04/24/2016   Procedure: MRI - CERVICAL SPINE WITHOUT CONTRAST AND LUMBAR SPINE WITHOUT CONTRAST;  Surgeon: Medication Radiologist, MD;  Location: [facility];  Service: Radiology;  Laterality: N/A;   RADIOLOGY WITH ANESTHESIA Right 01/24/2017   Procedure: MRI RIGHT HIP WITHOUT;  Surgeon: Radiologist, Medication, MD;  Location: [place] [facility];  Service: Radiology;  Laterality: Right;   RADIOLOGY WITH ANESTHESIA N/A 01/14/2018   Procedure: MRI WITH ANESTHESIA OF CERVICAL SPINE WITHOUT CONTRAST;  Surgeon: Radiologist, Medication, MD;  Location: [facility];  Service: Radiology;  Laterality: N/A;   SHOULDER ARTHROSCOPY DISTAL CLAVICLE EXCISION AND OPEN ROTATOR CUFF REPAIR     L shoulder    TONSILLECTOMY     as a child    Family History  Problem Relation Age of Onset   Heart disease Father    Diabetes Father    Hypertension Father    Hyperlipidemia Father    Heart attack Father    Blindness Mother    Diabetes Mother    Anesthesia problems Neg Hx    Hypotension Neg Hx    Malignant hyperthermia Neg Hx    Pseudochol deficiency Neg Hx    Colon cancer Neg Hx    Colon polyps Neg Hx    Esophageal cancer Neg Hx    Rectal cancer Neg Hx    Stomach cancer Neg Hx     Social History:  reports that he quit smoking about 14 years ago. His smoking use included cigarettes. He has never used smokeless tobacco. He reports that he does not currently use alcohol. He reports that he does not use drugs.  Review of Systems:  Last diabetic eye exam date ?  Date, done at the [place]  Last urine microalbumin date: 8/23  Last foot exam date:9/22  Symptoms of neuropathy:  None  Hypertension: Managed by PCP ACE/ARB medication: Lisinopril 40 mg daily  BP Readings from Last 3 Encounters:  11/08/21 122/82  09/28/21 121/75  09/13/21 118/72   CKD: He is not aware of this and has not been on any specific treatment or management  Lab Results  Component Value Date   CREATININE 1.56 (H) 11/06/2021   CREATININE 1.78 (H) 12/19/2020   CREATININE 1.64 (H) 09/08/2020    Lipid management: He is being managed with 10 mg pravastatin from the [place] with the following results    Lab Results  Component Value Date   CHOL 199 11/08/2021   CHOL 198 12/19/2020   CHOL 227 (H) 03/17/2020   Lab Results  Component Value Date   HDL 44.70 11/08/2021   HDL 42 12/19/2020   HDL 46 03/17/2020   Lab Results  Component Value Date   LDLCALC 122 (H) 11/08/2021   LDLCALC 131 (H) 12/19/2020   LDLCALC 137 (H) 03/17/2020   Lab Results  Component Value Date   TRIG 162.0 (H) 11/08/2021   TRIG 137 12/19/2020   TRIG 248 (H) 03/17/2020   Lab Results  Component Value Date   CHOLHDL 4 11/08/2021   CHOLHDL 4.7 12/19/2020   CHOLHDL 4.9 03/17/2020   No results found for: "LDLDIRECT"          Fib-4 interpretation is not validated for people under 26 or over 7 years of age.  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    ASSESSMENT/ PLAN:    Diabetes type 2 with severe obesity and associated hyperlipidemia and hypertension:   Current regimen: Metformin, Prandin and Rybelsus  See history of present illness for detailed discussion of current diabetes management, blood sugar patterns and problems identified  A1c is  increased at 7.5 As discussed above his medication management, glucose monitoring, diet and exercise management has been suboptimal and  Recommendations:  He will be referred to the nutritionist for meal planning and comprehensive diabetes education He will need to start taking his Rybelsus with water only and not milk Also he will go up to 7 mg when he finishes the 3 mg supply Also change his dosage of repaglinide to before his main meal at lunch instead of evening meal  instead of cereal may benefit from adding more protein to breakfast He will get his test trips refilled from the [facility] Prescription was sent Discussed checking blood sugars alternating fasting and 2 hours after meals and bring monitor for download Discussed postprandial hyperglycemia and blood sugar targets to be achieved after meals Encouraged him to start walking for exercise Trial of JARDIANCE 10 mg daily which will help his diabetes control control and potentially help his weight and blood pressure as well as chronic kidney disease Discussed action of SGLT 2 drugs on lowering glucose by decreasing kidney absorption of glucose, benefits of weight loss and lower blood pressure, possible side effects including candidiasis and dosage regimen  At the same time he will reduce his lisinopril to half tablet to avoid potential low blood pressure Since his metformin doses are limited by his renal dysfunction we will have him stop this with starting Jardiance  CKD: Discussed that this is likely from hypertension, glomerulosclerosis and diabetes and since he has no microalbuminuria will benefit from adding Jardiance  Reportedly has HYPOGONADISM but this needs to be evaluated Since he is currently not on medications free testosterone, prolactin and LH will be ordered  LIPIDS: Labs forwarded to PCP, does need higher doses of pravastatin with LDL above target  Patient Instructions  Check blood sugars on waking up 3 days  a week  Also check blood sugars about 2 hours after meals and do this after different meals by rotation  Recommended blood sugar levels on waking up are 90-130 and about 2 hours after meal is 130-160  Please bring your blood sugar monitor to each visit, thank you  When starting the JARDIANCE 1 tablet in the morning stop the metformin and reduce LISINOPRIL to half a tablet  Make sure you take Rybelsus only with water 30 minutes before breakfast  Take REPAGLINIDE a few minutes before breakfast and before dinner  Regular walking for exercise   Total visit time for evaluation and management of multiple problems and counseling = 45 minutes  Elayne Snare 11/08/2021, 4:28 PM

## 2021-11-15 ENCOUNTER — Ambulatory Visit (INDEPENDENT_AMBULATORY_CARE_PROVIDER_SITE_OTHER): Payer: Medicare Other | Admitting: Bariatrics

## 2021-11-15 LAB — TESTOSTERONE, FREE, TOTAL, SHBG
Sex Hormone Binding: 33.4 nmol/L (ref 19.3–76.4)
Testosterone, Free: 3.7 pg/mL — ABNORMAL LOW (ref 6.6–18.1)
Testosterone: 269 ng/dL (ref 264–916)

## 2021-11-15 LAB — PROLACTIN: Prolactin: 7.2 ng/mL (ref 4.0–15.2)

## 2021-11-23 ENCOUNTER — Encounter: Payer: Medicare Other | Attending: Endocrinology | Admitting: Dietician

## 2021-11-23 ENCOUNTER — Encounter: Payer: Self-pay | Admitting: Dietician

## 2021-11-23 DIAGNOSIS — I129 Hypertensive chronic kidney disease with stage 1 through stage 4 chronic kidney disease, or unspecified chronic kidney disease: Secondary | ICD-10-CM | POA: Insufficient documentation

## 2021-11-23 DIAGNOSIS — N183 Chronic kidney disease, stage 3 unspecified: Secondary | ICD-10-CM | POA: Insufficient documentation

## 2021-11-23 DIAGNOSIS — E1122 Type 2 diabetes mellitus with diabetic chronic kidney disease: Secondary | ICD-10-CM | POA: Insufficient documentation

## 2021-11-23 NOTE — Patient Instructions (Addendum)
Consider having your iron checked.   Aim for 150 minutes of physical activity weekly.

## 2021-11-23 NOTE — Progress Notes (Signed)
Diabetes Self-Management Education  Visit Type: First/Initial  Appt. Start Time: 1524 Appt. End Time: 1628  11/23/2021  Mr. Keith Calhoun, identified by name and date of birth, is a 66 y.o. male with a diagnosis of Diabetes: Type 2.   ASSESSMENT  Primary Concern: Pt states his doctor noticed he hasn't been checking his blood sugar and wanted to learn the importance of checking his blood sugar and what to do with the information.  History includes: seasonal allergies, anemia, anxiety, arthritis, type 2 diabetes, GERD, HLD, HTN. Labs noted:  A1C 7.5%, GFR 45.67 11/06/21. LDL 122, TGs 162 11/08/21 Medications include:  repaglinide, metformin, semaglutide,  Supplements: vitamin D, magnesium, vitamin B2.   Dietary/Lifestyle History:  Pt states he feels that he has not understood why he should care about his diabetes but now feels that he is excited to learn about why he should care and learn about what is going on in his body.    Patient lives alone and does the shopping and cooking for his mom who lives 2 blocks away. He states he goes to her house daily at 3pm to cook dinner for her.   Pt reports that the semaglutide has suppressed his appetite greatly. He states that sometimes he does not get hungry until 2pm, which then he will just have a fig newton.   Height '6\' 2"'$  (1.88 m), weight 281 lb (127.5 kg). Body mass index is 36.08 kg/m.   Diabetes Self-Management Education - 11/23/21 1532       Visit Information   Visit Type First/Initial      Initial Visit   Diabetes Type Type 2    Date Diagnosed 2017    Are you currently following a meal plan? No    Are you taking your medications as prescribed? Yes      Health Coping   How would you rate your overall health? Good      Psychosocial Assessment   Patient Belief/Attitude about Diabetes Motivated to manage diabetes    What is the hardest part about your diabetes right now, causing you the most concern, or is the most worrisome to  you about your diabetes?   Making healty food and beverage choices;Checking blood sugar    Self-care barriers None    Self-management support Doctor's office    Other persons present Patient    Patient Concerns Healthy Lifestyle;Monitoring    Special Needs None    Preferred Learning Style No preference indicated    Learning Readiness Ready    How often do you need to have someone help you when you read instructions, pamphlets, or other written materials from your doctor or pharmacy? 1 - Never    What is the last grade level you completed in school? bachelors      Pre-Education Assessment   Patient understands the diabetes disease and treatment process. Needs Instruction    Patient understands incorporating nutritional management into lifestyle. Needs Instruction    Patient undertands incorporating physical activity into lifestyle. Needs Instruction    Patient understands using medications safely. Needs Instruction    Patient understands monitoring blood glucose, interpreting and using results Needs Instruction    Patient understands prevention, detection, and treatment of acute complications. Needs Instruction    Patient understands prevention, detection, and treatment of chronic complications. Needs Instruction    Patient understands how to develop strategies to address psychosocial issues. Needs Instruction    Patient understands how to develop strategies to promote health/change behavior. Needs Instruction  Complications   Last HgB A1C per patient/outside source 7.5 %   11/01/21   How often do you check your blood sugar? 0 times/day (not testing)    Have you had a dilated eye exam in the past 12 months? Yes    Have you had a dental exam in the past 12 months? Yes    Are you checking your feet? Yes    How many days per week are you checking your feet? 7      Dietary Intake   Breakfast skips    Lunch 2pm fig newton    Snack (afternoon) 4pm a few bites of what he cooks for his mom     Dinner 6pm [employer]: fish and green beans and squash OR wendys burger and fries OR honey nut cheerios/frosted flakes    Snack (evening) oatmeal cream pie occasionally    Beverage(s) water, decaf coffee with equal and powdered cream, occasional sweet tea      Activity / Exercise   Activity / Exercise Type Light (walking / raking leaves)    How many days per week do you exercise? 0    How many minutes per day do you exercise? 0    Total minutes per week of exercise 0      Patient Education   Previous Diabetes Education No    Disease Pathophysiology Definition of diabetes, type 1 and 2, and the diagnosis of diabetes;Factors that contribute to the development of diabetes;Explored patient's options for treatment of their diabetes    Healthy Eating Role of diet in the treatment of diabetes and the relationship between the three main macronutrients and blood glucose level;Food label reading, portion sizes and measuring food.;Plate Method;Carbohydrate counting;Reviewed blood glucose goals for pre and post meals and how to evaluate the patients' food intake on their blood glucose level.;Meal timing in regards to the patients' current diabetes medication.;Information on hints to eating out and maintain blood glucose control.;Meal options for control of blood glucose level and chronic complications.    Being Active Role of exercise on diabetes management, blood pressure control and cardiac health.;Helped patient identify appropriate exercises in relation to his/her diabetes, diabetes complications and other health issue.    Medications Reviewed patients medication for diabetes, action, purpose, timing of dose and side effects.    Monitoring Identified appropriate SMBG and/or A1C goals.;Daily foot exams;Yearly dilated eye exam    Acute complications Taught prevention, symptoms, and  treatment of hypoglycemia - the 15 rule.;Discussed and identified patients' prevention, symptoms, and treatment of hyperglycemia.     Chronic complications Relationship between chronic complications and blood glucose control;Assessed and discussed foot care and prevention of foot problems;Lipid levels, blood glucose control and heart disease;Identified and discussed with patient  current chronic complications;Dental care;Retinopathy and reason for yearly dilated eye exams;Nephropathy, what it is, prevention of, the use of ACE, ARB's and early detection of through urine microalbumia.;Reviewed with patient heart disease, higher risk of, and prevention    Diabetes Stress and Support Identified and addressed patients feelings and concerns about diabetes;Worked with patient to identify barriers to care and solutions;Role of stress on diabetes    Lifestyle and Health Coping Lifestyle issues that need to be addressed for better diabetes care      Individualized Goals (developed by patient)   Nutrition General guidelines for healthy choices and portions discussed    Physical Activity Exercise 5-7 days per week;30 minutes per day    Medications take my medication as prescribed    Monitoring  Test my blood glucose as discussed    Problem Solving Eating Pattern    Reducing Risk examine blood glucose patterns;do foot checks daily;treat hypoglycemia with 15 grams of carbs if blood glucose less than '70mg'$ /dL    Health Coping Ask for help with psychological, social, or emotional issues      Post-Education Assessment   Patient understands the diabetes disease and treatment process. Comprehends key points    Patient understands incorporating nutritional management into lifestyle. Comprehends key points    Patient undertands incorporating physical activity into lifestyle. Comprehends key points    Patient understands using medications safely. Comphrehends key points    Patient understands monitoring blood glucose, interpreting and using results Comprehends key points    Patient understands prevention, detection, and treatment of acute  complications. Comprehends key points    Patient understands prevention, detection, and treatment of chronic complications. Comprehends key points    Patient understands how to develop strategies to address psychosocial issues. Comprehends key points    Patient understands how to develop strategies to promote health/change behavior. Comprehends key points      Outcomes   Expected Outcomes Demonstrated interest in learning. Expect positive outcomes    Future DMSE 2 months    Program Status Not Completed             Individualized Plan for Diabetes Self-Management Training:   Learning Objective:  Patient will have a greater understanding of diabetes self-management. Patient education plan is to attend individual and/or group sessions per assessed needs and concerns.   Plan:   Patient Instructions  Consider having your iron checked.   Aim for 150 minutes of physical activity weekly.   Expected Outcomes:  Demonstrated interest in learning. Expect positive outcomes  Education material provided: ADA - How to Thrive: A Guide for Your Journey with Diabetes, A1C conversion sheet, Meal plan card, My Plate, and Snack sheet  If problems or questions, patient to contact team via:  Phone  Future DSME appointment: 2 months

## 2021-11-30 ENCOUNTER — Encounter (INDEPENDENT_AMBULATORY_CARE_PROVIDER_SITE_OTHER): Payer: Self-pay | Admitting: Family Medicine

## 2021-11-30 ENCOUNTER — Ambulatory Visit (INDEPENDENT_AMBULATORY_CARE_PROVIDER_SITE_OTHER): Payer: Medicare Other | Admitting: Family Medicine

## 2021-11-30 VITALS — BP 100/62 | HR 52 | Temp 97.9°F | Ht 74.0 in | Wt 275.0 lb

## 2021-11-30 DIAGNOSIS — E1165 Type 2 diabetes mellitus with hyperglycemia: Secondary | ICD-10-CM | POA: Diagnosis not present

## 2021-11-30 DIAGNOSIS — Z7984 Long term (current) use of oral hypoglycemic drugs: Secondary | ICD-10-CM

## 2021-11-30 DIAGNOSIS — E7849 Other hyperlipidemia: Secondary | ICD-10-CM

## 2021-11-30 DIAGNOSIS — E669 Obesity, unspecified: Secondary | ICD-10-CM

## 2021-11-30 DIAGNOSIS — Z6835 Body mass index (BMI) 35.0-35.9, adult: Secondary | ICD-10-CM

## 2021-11-30 DIAGNOSIS — E559 Vitamin D deficiency, unspecified: Secondary | ICD-10-CM

## 2021-11-30 MED ORDER — VITAMIN D (ERGOCALCIFEROL) 1.25 MG (50000 UNIT) PO CAPS: 50000.0000 [IU] | ORAL_CAPSULE | ORAL | 0 refills | Status: DC

## 2021-12-05 NOTE — Progress Notes (Signed)
Chief Complaint:   OBESITY Keith Calhoun is here to discuss his progress with his obesity treatment plan along with follow-up of his obesity related diagnoses. Beatrice is on the Category 2 Plan and states he is following his eating plan approximately 40% of the time. Keith Calhoun states he is walking 20 minutes 5 times per week.  Today's visit was #: 54 Starting weight: 296 lbs Starting date: 08/11/2020 Today's weight: 275 lbs Today's date: 11/30/2021 Total lbs lost to date: 21 lbs Total lbs lost since last in-office visit: 0  Interim History: Keith Calhoun is a patient of Dr. Roosvelt Harps. Feels elated to be in the program. Trying to follow meal plan as strictly as he can. Noticing inconsistency with meal plan and exercising. Thinks Cat 2 would be easiest to stick to. When taking Rybelsus sometimes he has to force himself to eat.  Subjective:   1. Type 2 diabetes mellitus with hyperglycemia, without long-term current use of insulin (HCC) Keith Calhoun is on Glucophage, Jardiance, Rybelsus. Last A1c 7.5 in Aug 2023.  2. Other hyperlipidemia Keith Calhoun is on pravastatin 10 mg. Last LDL 122, HDL 44.  3. Vitamin D deficiency Keith Calhoun's last Vit D level in Oct 2022 of 30.1. He notes fatigue.  Assessment/Plan:   1. Type 2 diabetes mellitus with hyperglycemia, without long-term current use of insulin ([place]) Will follow up on food intake at next appointment without changes in dose. If still not able to get all food in, will decrease Rybelsus.  2. Other hyperlipidemia Will repeat labs in 3 months.  3. Vitamin D deficiency We will refill Vit D 50k IU once a week for 1 month with 0 refills.  -Refill Vitamin D, Ergocalciferol, (DRISDOL) 1.25 MG (50000 UNIT) CAPS capsule; Take 1 capsule (50,000 Units total) by mouth every 7 (seven) days.  Dispense: 5 capsule; Refill: 0  4. Obesity, Current BMI 35.3 Keith Calhoun is currently in the action stage of change. As such, his goal is to continue with weight loss efforts. He has agreed to the Category 2  Plan.   Exercise goals: All adults should avoid inactivity. Some physical activity is better than none, and adults who participate in any amount of physical activity gain some health benefits.  Behavioral modification strategies: increasing lean protein intake, meal planning and cooking strategies, keeping healthy foods in the home, and planning for success.  Keith Calhoun has agreed to follow-up with our clinic in 3 weeks. He was informed of the importance of frequent follow-up visits to maximize his success with intensive lifestyle modifications for his multiple health conditions.   Objective:   Blood pressure 100/62, pulse (!) 52, temperature 97.9 F (36.6 C), height '6\' 2"'$  (1.88 m), weight 275 lb (124.7 kg), SpO2 98 %. Body mass index is 35.31 kg/m.  General: Cooperative, alert, well developed, in no acute distress. HEENT: Conjunctivae and lids unremarkable. Cardiovascular: Regular rhythm.  Lungs: Normal work of breathing. Neurologic: No focal deficits.   Lab Results  Component Value Date   CREATININE 1.56 (H) 11/06/2021   BUN 30 (H) 11/06/2021   NA 138 11/06/2021   K 4.6 11/06/2021   CL 105 11/06/2021   CO2 24 11/06/2021   Lab Results  Component Value Date   ALT 11 11/06/2021   AST 15 11/06/2021   ALKPHOS 62 11/06/2021   BILITOT 0.3 11/06/2021   Lab Results  Component Value Date   HGBA1C 7.5 (H) 11/06/2021   HGBA1C 8.6 (A) 06/27/2021   HGBA1C 7.2 (H) 12/19/2020   HGBA1C 6.8 (A)  12/01/2020   HGBA1C 10.3 (A) 05/26/2020   No results found for: "INSULIN" Lab Results  Component Value Date   TSH 0.561 08/11/2020   Lab Results  Component Value Date   CHOL 199 11/08/2021   HDL 44.70 11/08/2021   LDLCALC 122 (H) 11/08/2021   TRIG 162.0 (H) 11/08/2021   CHOLHDL 4 11/08/2021   Lab Results  Component Value Date   VD25OH 30.1 12/19/2020   VD25OH 32.4 08/11/2020   Lab Results  Component Value Date   WBC 6.4 03/17/2020   HGB 13.1 03/17/2020   HCT 40.7 03/17/2020   MCV  82 03/17/2020   PLT 314 03/17/2020   No results found for: "IRON", "TIBC", "FERRITIN"  Attestation Statements:   Reviewed by clinician on day of visit: allergies, medications, problem list, medical history, surgical history, family history, social history, and previous encounter notes.  I, Elnora Morrison, RMA am acting as transcriptionist for Coralie Common, MD.  I have reviewed the above documentation for accuracy and completeness, and I agree with the above. - Coralie Common, MD

## 2021-12-21 ENCOUNTER — Ambulatory Visit (INDEPENDENT_AMBULATORY_CARE_PROVIDER_SITE_OTHER): Payer: Medicare Other | Admitting: Family Medicine

## 2021-12-21 ENCOUNTER — Encounter (INDEPENDENT_AMBULATORY_CARE_PROVIDER_SITE_OTHER): Payer: Self-pay | Admitting: Family Medicine

## 2021-12-21 VITALS — BP 109/63 | HR 80 | Temp 98.9°F | Ht 74.0 in | Wt 278.0 lb

## 2021-12-21 DIAGNOSIS — E559 Vitamin D deficiency, unspecified: Secondary | ICD-10-CM

## 2021-12-21 DIAGNOSIS — Z7984 Long term (current) use of oral hypoglycemic drugs: Secondary | ICD-10-CM

## 2021-12-21 DIAGNOSIS — E1159 Type 2 diabetes mellitus with other circulatory complications: Secondary | ICD-10-CM | POA: Diagnosis not present

## 2021-12-21 DIAGNOSIS — I152 Hypertension secondary to endocrine disorders: Secondary | ICD-10-CM | POA: Diagnosis not present

## 2021-12-21 DIAGNOSIS — Z6835 Body mass index (BMI) 35.0-35.9, adult: Secondary | ICD-10-CM

## 2021-12-21 DIAGNOSIS — E1165 Type 2 diabetes mellitus with hyperglycemia: Secondary | ICD-10-CM | POA: Diagnosis not present

## 2021-12-21 DIAGNOSIS — Z6841 Body Mass Index (BMI) 40.0 and over, adult: Secondary | ICD-10-CM

## 2021-12-21 DIAGNOSIS — E669 Obesity, unspecified: Secondary | ICD-10-CM

## 2021-12-21 MED ORDER — VITAMIN D (ERGOCALCIFEROL) 1.25 MG (50000 UNIT) PO CAPS: 50000.0000 [IU] | ORAL_CAPSULE | ORAL | 0 refills | Status: AC

## 2021-12-25 ENCOUNTER — Telehealth: Payer: Self-pay

## 2021-12-25 ENCOUNTER — Other Ambulatory Visit: Payer: Self-pay

## 2021-12-25 NOTE — Telephone Encounter (Signed)
Spoke with [facility] and they state that patient referral was expired back on Sept 2022. States that visits must be being billed under secondary because [facility] is not paying for them. States patient has to contact his PCP from [place] to have another referral sent. Patient is aware and he will go to [facility] to see what he needs to do. Per [facility] they will not be filling and medications for patient at this time.

## 2021-12-25 NOTE — Telephone Encounter (Signed)
Patient called in about his VA benefits. States they have expired and he needs last chart notes faxed over. Patient did not have number where fax needed to go. I sent to [phone] where they have been sent to in the past.

## 2021-12-25 NOTE — Telephone Encounter (Signed)
He is currently out of medication and has been out for two weeks what do you recommend?

## 2021-12-26 NOTE — Telephone Encounter (Signed)
I tried reaching out to patient as some medication will cost too much for patient if sent to local pharmacy.

## 2021-12-26 NOTE — Telephone Encounter (Signed)
Noted  

## 2021-12-27 NOTE — Progress Notes (Signed)
Chief Complaint:   OBESITY Keith Calhoun is here to discuss his progress with his obesity treatment plan along with follow-up of his obesity related diagnoses. Keith Calhoun is on the Category 2 Plan and states he is following his eating plan approximately 60% of the time. Keith Calhoun states he is walking 20 minutes 7 times per week.  Today's visit was #: 14 Starting weight: 296 lbs Starting date: 08/11/2020 Today's weight: 278 lbs Today's date: 12/21/2021 Total lbs lost to date: 18 lbs Total lbs lost since last in-office visit: 0  Interim History: Keith Calhoun feeling really down on himself for weight goal. Has been eating more ice cream recently and recognizes he can not have ice cream in the house and stay in control of intake.   Subjective:   1. Type 2 diabetes mellitus with hyperglycemia, without long-term current use of insulin ([place]) Keith Calhoun on [place], [place], [place]. Last A1c 7.5, insulin not done. Has realized he can not keep ice cream in the house.   2. Hypertension associated with diabetes ([place]) Keith Calhoun's blood pressure well controlled today. Denies chest pain, chest pressure and headache.  3. Vitamin D deficiency Keith Calhoun is currently taking prescription Vit D 50,000 IU once a week. She notes fatigue. Last Vit D level of 30.1.  Assessment/Plan:   1. Type 2 diabetes mellitus with hyperglycemia, without long-term current use of insulin (HCC) Continue current medications without changes in medications or dose.  2. Hypertension associated with diabetes ([place]) Continue current medications.  3. Vitamin D deficiency We will refill Vit D 50k IU once a week for 1 month with 0 refills. Labs at next appointment.  -Refill Vitamin D, Ergocalciferol, (DRISDOL) 1.25 MG (50000 UNIT) CAPS capsule; Take 1 capsule (50,000 Units total) by mouth every 7 (seven) days.  Dispense: 5 capsule; Refill: 0  4. Obesity, Current BMI 35.7 Keith Calhoun is currently in the action stage of change. As such, his goal is to continue with  weight loss efforts. He has agreed to the Category 3 Plan.   Exercise goals: As is.   Behavioral modification strategies: increasing lean protein intake, meal planning and cooking strategies, keeping healthy foods in the home, and planning for success.  Keith Calhoun has agreed to follow-up with our clinic in 3 weeks. He was informed of the importance of frequent follow-up visits to maximize his success with intensive lifestyle modifications for his multiple health conditions.   Objective:   Blood pressure 109/63, pulse 80, temperature 98.9 F (37.2 C), height '6\' 2"'$  (1.88 m), SpO2 95 %. Body mass index is 35.31 kg/m.  General: Cooperative, alert, well developed, in no acute distress. HEENT: Conjunctivae and lids unremarkable. Cardiovascular: Regular rhythm.  Lungs: Normal work of breathing. Neurologic: No focal deficits.   Lab Results  Component Value Date   CREATININE 1.56 (H) 11/06/2021   BUN 30 (H) 11/06/2021   NA 138 11/06/2021   K 4.6 11/06/2021   CL 105 11/06/2021   CO2 24 11/06/2021   Lab Results  Component Value Date   ALT 11 11/06/2021   AST 15 11/06/2021   ALKPHOS 62 11/06/2021   BILITOT 0.3 11/06/2021   Lab Results  Component Value Date   HGBA1C 7.5 (H) 11/06/2021   HGBA1C 8.6 (A) 06/27/2021   HGBA1C 7.2 (H) 12/19/2020   HGBA1C 6.8 (A) 12/01/2020   HGBA1C 10.3 (A) 05/26/2020   No results found for: "INSULIN" Lab Results  Component Value Date   TSH 0.561 08/11/2020   Lab Results  Component Value Date  CHOL 199 11/08/2021   HDL 44.70 11/08/2021   LDLCALC 122 (H) 11/08/2021   TRIG 162.0 (H) 11/08/2021   CHOLHDL 4 11/08/2021   Lab Results  Component Value Date   VD25OH 30.1 12/19/2020   VD25OH 32.4 08/11/2020   Lab Results  Component Value Date   WBC 6.4 03/17/2020   HGB 13.1 03/17/2020   HCT 40.7 03/17/2020   MCV 82 03/17/2020   PLT 314 03/17/2020   No results found for: "IRON", "TIBC", "FERRITIN"  Attestation Statements:   Reviewed by  clinician on day of visit: allergies, medications, problem list, medical history, surgical history, family history, social history, and previous encounter notes.  I, Elnora Morrison, RMA am acting as transcriptionist for Coralie Common, MD. I have reviewed the above documentation for accuracy and completeness, and I agree with the above. - Coralie Common, MD

## 2021-12-28 DIAGNOSIS — H60332 Swimmer's ear, left ear: Secondary | ICD-10-CM | POA: Diagnosis not present

## 2021-12-28 DIAGNOSIS — H6122 Impacted cerumen, left ear: Secondary | ICD-10-CM | POA: Diagnosis not present

## 2021-12-28 NOTE — Telephone Encounter (Signed)
Patient left voicemail in regards to medicine. I tried calling back no answer. I left voicemail.

## 2021-12-29 ENCOUNTER — Other Ambulatory Visit: Payer: Non-veteran care

## 2021-12-29 NOTE — Telephone Encounter (Signed)
Patient left voicemail stating [facility] request for authorization be sent to [phone]. I informed patient last call that if [facility] needs Dr Dwyane Dee to complete they would need to send request. I have sent over last chart notes to number above request for continuation of care.

## 2022-01-01 ENCOUNTER — Other Ambulatory Visit: Payer: Non-veteran care

## 2022-01-02 ENCOUNTER — Ambulatory Visit: Payer: Non-veteran care | Admitting: Endocrinology

## 2022-01-02 MED ORDER — RYBELSUS 7 MG PO TABS: 7.0000 mg | ORAL_TABLET | Freq: Every day | ORAL | 2 refills | Status: DC

## 2022-01-02 NOTE — Telephone Encounter (Signed)
Patient called back needing to know what medications he is currently on. Per Dr Ronnie Derby notes patient is on Repaglinide, 1/2 tablet of '40mg'$  lisiniopril, jardiance, and rybelsus '7mg'$ . HE did ask for Rybelsus to be sent to [facility] because [facility] will not pay for that medication. Rx sent. He will reach out to [place] and see if they received chart notes that I sent over on 12/29/21.

## 2022-01-02 NOTE — Addendum Note (Signed)
Addended by: Cinda Quest on: 01/02/2022 08:54 AM   Modules accepted: Orders

## 2022-01-11 ENCOUNTER — Ambulatory Visit (INDEPENDENT_AMBULATORY_CARE_PROVIDER_SITE_OTHER): Payer: Medicare Other | Admitting: Internal Medicine

## 2022-01-24 ENCOUNTER — Ambulatory Visit: Payer: Non-veteran care | Admitting: Dietician

## 2022-02-05 ENCOUNTER — Ambulatory Visit (INDEPENDENT_AMBULATORY_CARE_PROVIDER_SITE_OTHER): Payer: Medicare Other | Admitting: Internal Medicine

## 2022-02-26 ENCOUNTER — Encounter: Payer: Non-veteran care | Admitting: Dietician

## 2022-02-26 NOTE — Progress Notes (Deleted)
NEUROLOGY FOLLOW UP OFFICE NOTE  MARQUINN MESCHKE [id number]  Assessment/Plan:   Migraine without aura, without status migrainosus, not intractable, now possibly transformed to cluster headache Essential tremor   Migraine prevention:  Continue nortriptyline to 9m at bedtime; Continue topiramate 1048mat bedtime *** Tremor management:  primidone 506mt bedtime, topiramate 100m44m bedtime *** Migraine rescue:  Zomig 5mg 30m Limit use of pain relievers to no more than 2 days out of week to prevent risk of rebound or medication-overuse headache. Keep headache diary Follow up 6 months.     Subjective:  Eleuterio Lakeith Careaga 66 ye7 old right-handed man with hypertension, type 2 diabetes mellitus, hyperlipidemia, chronic neck and back pain, GERD, and asthma who folllows up for migraine and tremor.   UPDATE:  Did not have sed rate and CRP performed.     Migraines:  Migraines started increasing at the end of summer.  They occur at 2 in the morning.  They occur every night, involving the right temporal/periorbital region.  There is associated eye lacrimation.  Takes Zomig and lasts one hour.  Laying flat aggravates the pain, so he sleeps upright.  Earlier this month, nortriptyline was increased, currently on 25mg 68ma week, then 50mg a76mdtime.  Migraines have improved.  He has not had any recent migraines.  However, he notices some dizziness.  It doesn't happen all of the time.     Tremor:  Taking primidone 50mg at2mtime.  Also taking topiramate 100mg at 31mime.  Rarely has tremor.  Maybe only if he is stressed.    Current NSAIDS: Aspirin 325 mg Current analgesics: Acetaminophen Current triptans: zolmitriptan 5mg Curre73mergotamine: None Current anti-emetic: None Current muscle relaxants: None Current anti-anxiolytic: None Current sleep aide: None Current Antihypertensive medications: Lisinopril Current Antidepressant medications: Nortriptyline 50 mg at bedtime Current Anticonvulsant  medications: topiramate 100mg at be47me; gabapentin 300 mg daily, primidone 50mg at bed63m (tremor) Current anti-CGRP: none Vitamins/Herbal/Supplements: Magnesium citrate 400 mg daily, riboflavin 400 mg, co-Q10 100 mg 3 times daily Current Antihistamines/Decongestants: None Other therapy: None     Caffeine: No Diet: Hydrates Exercise:  He is more active at work. Depression: No; Anxiety: Yes, only because of headaches Other pain: History of chronic neck and back pain.  Sleep hygiene: Poor.  Trouble falling asleep and staying asleep, often due to ongoing generalized aches and pain.    HISTORY: Onset: Age 75 or 60 yea83 old47ocation:  Right temporal Quality:  Throbbing/pressure Initial intensity:  8/10.  He denies thunderclap headache or severe headache.  He may wake up with headache (as they often occur at bedtime) but headaches themselves do not wake him up. Aura:  no Prodrome:  no Postdrome:  no Associated symptoms: Nausea, photophobia.  No vomiting, phonophobia or visual disturbance.  He denies associated unilateral numbness or weakness. Initial duration:  30 minutes with Zomig 5mg, otherwi39mseveral hours Initial Frequency:  Twice daily, always at night. Initial Frequency of abortive medication: 5 to 7 days a week Triggers: Anxiety Relieving factors: Ice, massage Activity:  aggravates  MRI and MRA of head on 02/16/2021 were unremarkable.    Past NSAIDS:  ibuprofen, Celebrex, naproxen 500mg Past ana65mics:  tramadol, hydrocodone, tramadol Past abortive triptans:  no Past muscle relaxants:  no Past anti-emetic:  no Past antihypertensive medications:  no Past antidepressant medications:  amitriptyline 25mg (took bri89m for a few days to help with sleep) Past anticonvulsant medications:   none+ Past vitamins/Herbal/Supplements:  no Past antihistamines/decongestants:  no Other  past therapies:  no   Family history of headache:  No  PAST MEDICAL HISTORY: Past Medical  History:  Diagnosis Date   Allergy    seasonal allergies per pt   Anemia    2012- turned down for donating blood, due to low count    Anxiety    claustrophobia    Arthritis    L knee , L shoudler    Bronchitis    Chronic pain    Diabetes mellitus    dx 2007   GERD (gastroesophageal reflux disease)    Headache    ?? 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      albuterol (PROVENTIL HFA;VENTOLIN HFA) 108 (90 Base) MCG/ACT inhaler Inhale 2 puffs every 6 (six) hours as needed into the lungs for wheezing or shortness of breath.      ASPIRIN PO Take 81 mg by mouth.     cetirizine (ZYRTEC) 10 MG chewable tablet Chew 10 mg by mouth daily.     Cholecalciferol 50 MCG (2000 UT) TBDP Take by mouth.     docusate sodium (COLACE) 50 MG capsule Take 50 mg by mouth 2 (two) times daily.     empagliflozin (JARDIANCE) 10 MG TABS tablet Take 1 tablet (10 mg total) by mouth daily with breakfast. 30 tablet 3   gabapentin (NEURONTIN) 300 MG capsule Take 300 mg by mouth at bedtime.     glucose blood (ACCU-CHEK GUIDE) test strip 1 each by Other route daily. And lancets 1/day 90 each 3   latanoprost (XALATAN) 0.005 % ophthalmic solution Place 1 drop into both eyes at bedtime.     lisinopril (PRINIVIL,ZESTRIL) 40 MG tablet Take 40 mg by mouth at bedtime.     Magnesium 400 MG TABS Take 400 mg by mouth daily. 30 tablet 5   metFORMIN (GLUCOPHAGE-XR) 750 MG 24 hr tablet Take 1 tablet (750 mg total) by mouth daily. 90 tablet 3   MOVIPREP 100 g SOLR Moviprep as directed, no substitutions 1 kit 0   naproxen (NAPROSYN) 500 MG tablet Take 500 mg by mouth daily as needed.      nirmatrelvir/ritonavir EUA, renal dosing, (PAXLOVID) 10 x 150 MG & 10 x 100MG TABS Take 2 tablets by mouth 2 (two) times daily for 5 days. (Take nirmatrelvir 150 mg one tablet twice daily for 5 days and ritonavir 100 mg one tablet twice daily for 5 days) Patient GFR is 42 20 tablet 0   nortriptyline (PAMELOR) 25 MG capsule Take 2 capsules (50 mg total) by mouth at bedtime. Take 25 mg at bedtime for one week,  then increase to 50 mg at bedtime. 60 capsule 2   oxymetazoline (AFRIN) 0.05 % nasal spray Place 1 spray 2 (two) times daily as needed into both nostrils for congestion.     pravastatin (PRAVACHOL) 10 MG tablet Take 10 mg by mouth at bedtime.     primidone (MYSOLINE) 50 MG tablet Take 1/2 tablet at bedtime for one week, then increase to 1 tablet at bedtime. 30 tablet 2   repaglinide (PRANDIN) 2 MG  tablet Take 1 tablet (2 mg total) by mouth 2 (two) times daily before a meal. 180 tablet 2   Riboflavin 400 MG TABS Take 1 tablet by mouth daily. 90 tablet 3   Semaglutide (RYBELSUS) 7 MG TABS Take 7 mg by mouth daily. 30 tablet 2   topiramate (TOPAMAX) 100 MG tablet Take one capsule at bedtime (Patient taking differently: 100 mg. Take one capsule at bedtime) 30 tablet 0   Vitamin D, Ergocalciferol, (DRISDOL) 1.25 MG (50000 UNIT) CAPS capsule Take 1 capsule (50,000 Units total) by mouth every 7 (seven) days. 5 capsule 0   ZOLMitriptan (ZOMIG PO) Take by mouth.     Current Facility-Administered Medications on File Prior to Visit  Medication Dose Route Frequency Provider Last Rate Last Admin   0.9 %  sodium chloride infusion  500 mL Intravenous Once Nandigam, Kavitha V, MD        ALLERGIES: No Known Allergies  FAMILY HISTORY: Family History  Problem Relation Age of Onset   Heart disease Father    Diabetes Father    Hypertension Father    Hyperlipidemia Father    Heart attack Father    Blindness Mother    Diabetes Mother    Anesthesia problems Neg Hx    Hypotension Neg Hx    Malignant  hyperthermia Neg Hx    Pseudochol deficiency Neg Hx    Colon cancer Neg Hx    Colon polyps Neg Hx    Esophageal cancer Neg Hx    Rectal cancer Neg Hx    Stomach cancer Neg Hx       Objective:  *** General: No acute distress.  Patient appears ***-groomed.   Head:  Normocephalic/atraumatic Eyes:  Fundi examined but not visualized Neck: supple, no paraspinal tenderness, full range of motion Heart:  Regular rate and rhythm Lungs:  Clear to auscultation bilaterally Back: No paraspinal tenderness Neurological Exam: alert and oriented to person, place, and time.  Speech fluent and not dysarthric, language intact.  CN II-XII intact. Bulk and tone normal, muscle strength 5/5 throughout.  Sensation to light touch intact.  Deep tendon reflexes 2+ throughout, toes downgoing.  Finger to nose testing intact.  Gait normal, Romberg negative.   Metta Clines, DO  CC: ***

## 2022-02-27 ENCOUNTER — Ambulatory Visit: Payer: Non-veteran care | Admitting: Neurology

## 2022-06-15 ENCOUNTER — Telehealth: Payer: Self-pay | Admitting: Family Medicine

## 2022-06-15 DIAGNOSIS — J441 Chronic obstructive pulmonary disease with (acute) exacerbation: Secondary | ICD-10-CM

## 2022-06-15 DIAGNOSIS — J302 Other seasonal allergic rhinitis: Secondary | ICD-10-CM

## 2022-06-15 MED ORDER — PREDNISONE 20 MG PO TABS: 40.0000 mg | ORAL_TABLET | Freq: Every day | ORAL | 0 refills | Status: AC

## 2022-06-15 MED ORDER — ALBUTEROL SULFATE HFA 108 (90 BASE) MCG/ACT IN AERS: 2.0000 | INHALATION_SPRAY | Freq: Four times a day (QID) | RESPIRATORY_TRACT | 0 refills | Status: AC | PRN

## 2022-06-15 MED ORDER — CETIRIZINE HCL 10 MG PO CHEW
10.0000 mg | CHEWABLE_TABLET | Freq: Every day | ORAL | 2 refills | Status: AC
Start: 2022-06-15 — End: 2023-09-18

## 2022-06-15 MED ORDER — DOXYCYCLINE HYCLATE 100 MG PO TABS: 100.0000 mg | ORAL_TABLET | Freq: Two times a day (BID) | ORAL | 0 refills | Status: AC

## 2022-06-15 NOTE — Telephone Encounter (Signed)
    Patient Active Problem List   Diagnosis Date Noted   COVID-19 09/28/2021   Class 2 severe obesity due to excess calories with serious comorbidity and body mass index (BMI) of 38.0 to 38.9 in adult 03/17/2020   Essential hypertension 03/17/2020   Hyperlipidemia associated with type 2 diabetes mellitus 03/17/2020   Intention tremor 03/17/2020   Sleep concern 09/02/2018   History of total right hip replacement 07/09/2017   Chronic midline low back pain without sciatica 06/29/2016   Muscle spasm 06/29/2016   Neck pain 06/29/2016   Right arm numbness 06/29/2016   Status post total left knee replacement 10/21/2014   Type 2 DM with CKD stage 3 and hypertension 09/07/2014   Gastroesophageal reflux disease without esophagitis 09/07/2014   HLD (hyperlipidemia) 09/07/2014   Primary osteoarthritis of left knee 07/22/2014   Internal and external prolapsed hemorrhoids 03/15/2011   Past Medical History:  Diagnosis Date   Allergy    seasonal allergies per pt   Anemia    2012- turned down for donating blood, due to low count    Anxiety    claustrophobia    Arthritis    L knee , L shoudler    Bronchitis    Chronic pain    Diabetes mellitus    dx 2007   GERD (gastroesophageal reflux disease)    Headache    ?? 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 Corliss Skains, MD;  Location: [facility] OR;  Service: General;  Laterality: N/A;   JOINT REPLACEMENT     knee (L) and hip (R)   KNEE SURGERY  1978   left   MULTIPLE TOOTH EXTRACTIONS     RADIOLOGY WITH ANESTHESIA N/A 04/24/2016   Procedure: MRI - CERVICAL SPINE WITHOUT CONTRAST AND LUMBAR SPINE WITHOUT CONTRAST;  Surgeon:  Medication Radiologist, MD;  Location: [facility] OR;  Service: Radiology;  Laterality: N/A;   RADIOLOGY WITH ANESTHESIA Right 01/24/2017   Procedure: MRI RIGHT HIP WITHOUT;  Surgeon: Radiologist, Medication, MD;  Location: [facility] OR;  Service: Radiology;  Laterality: Right;   RADIOLOGY WITH ANESTHESIA N/A 01/14/2018   Procedure: MRI WITH ANESTHESIA OF CERVICAL SPINE WITHOUT CONTRAST;  Surgeon: Radiologist, Medication, MD;  Location: [facility] OR;  Service: Radiology;  Laterality: N/A;   SHOULDER ARTHROSCOPY DISTAL CLAVICLE EXCISION AND OPEN ROTATOR CUFF REPAIR     L shoulder    TONSILLECTOMY     as a child   Social History   Tobacco Use   Smoking status: Former    Types: Cigarettes    Quit date: 03/13/2007    Years since quitting: 15.2   Smokeless tobacco: Never  Vaping Use   Vaping Use: Never used  Substance Use Topics   Alcohol use: Not Currently   Drug use: No   No Known Allergies    COPD with acute exacerbation  Seasonal allergies

## 2022-07-11 ENCOUNTER — Encounter: Payer: Self-pay | Admitting: Gastroenterology

## 2023-04-01 DIAGNOSIS — M5414 Radiculopathy, thoracic region: Secondary | ICD-10-CM | POA: Diagnosis not present

## 2023-04-01 DIAGNOSIS — M9901 Segmental and somatic dysfunction of cervical region: Secondary | ICD-10-CM | POA: Diagnosis not present

## 2023-04-01 DIAGNOSIS — M9902 Segmental and somatic dysfunction of thoracic region: Secondary | ICD-10-CM | POA: Diagnosis not present

## 2023-04-01 DIAGNOSIS — M5417 Radiculopathy, lumbosacral region: Secondary | ICD-10-CM | POA: Diagnosis not present

## 2023-04-01 DIAGNOSIS — M9903 Segmental and somatic dysfunction of lumbar region: Secondary | ICD-10-CM | POA: Diagnosis not present

## 2023-04-01 DIAGNOSIS — M531 Cervicobrachial syndrome: Secondary | ICD-10-CM | POA: Diagnosis not present

## 2023-04-15 DIAGNOSIS — M9902 Segmental and somatic dysfunction of thoracic region: Secondary | ICD-10-CM | POA: Diagnosis not present

## 2023-04-15 DIAGNOSIS — M9901 Segmental and somatic dysfunction of cervical region: Secondary | ICD-10-CM | POA: Diagnosis not present

## 2023-04-15 DIAGNOSIS — M5417 Radiculopathy, lumbosacral region: Secondary | ICD-10-CM | POA: Diagnosis not present

## 2023-04-15 DIAGNOSIS — M9903 Segmental and somatic dysfunction of lumbar region: Secondary | ICD-10-CM | POA: Diagnosis not present

## 2023-04-15 DIAGNOSIS — M531 Cervicobrachial syndrome: Secondary | ICD-10-CM | POA: Diagnosis not present

## 2023-04-15 DIAGNOSIS — M5414 Radiculopathy, thoracic region: Secondary | ICD-10-CM | POA: Diagnosis not present

## 2023-04-18 NOTE — Progress Notes (Signed)
 NEUROLOGY FOLLOW UP OFFICE NOTE  Keith Calhoun [id number]  Assessment/Plan:   Migraine without aura, without status migrainosus, not intractable, now possibly transformed to cluster headache Essential tremor   Migraine prevention:  Restart nortriptyline  10mg  at bedtime.  We can increase to 25mg  at bedtime in 4 weeks if needed.  Will hold off on topiramate  until I can review his kidney function (obtain recent labs from PCP). Tremor management:  hold off treatment for now. Migraine rescue:  Zomig 5mg .  Will have him try samples of Ubrelvy  100mg  Limit use of pain relievers to no more than 2 days out of week to prevent risk of rebound or medication-overuse headache. Keep headache diary Follow up 6 months.     Subjective:  Keith Calhoun is a 68 year old right-handed man with hypertension, type 2 diabetes mellitus, CKD stage 3, hyperlipidemia, chronic neck and back pain, GERD, and asthma who folllows up for migraine and tremor.   UPDATE: MRI and MRA of head on 02/16/2021 were unremarkable.   [facility].  He subsequently discontinued topiramate , nortriptyline  and primidone .  Headaches had improved.  However, her mom became ill 3 weeks ago and he has since had increased in migraines.  They have been occurring daily.  Lasts 1 hour with Zomig but has limited supply.  Overall, tremors have remained stable but thinks they are worse due to stress and pain.  Current NSAIDS: Aspirin 325 mg Current analgesics: Acetaminophen , naproxen 500mg  Current triptans: zolmitriptan 5mg  Current ergotamine: None Current anti-emetic: None Current muscle relaxants: None Current anti-anxiolytic: None Current sleep aide: None Current Antihypertensive medications: Lisinopril Current Antidepressant medications: none Current Anticonvulsant medications: ; gabapentin 300mg /300mg /600mg  Current anti-CGRP: none Vitamins/Herbal/Supplements: Magnesium  citrate  400 mg daily, riboflavin  400 mg Current Antihistamines/Decongestants: None Other therapy: None     Caffeine: No Diet: Hydrates Exercise:  He is more active at work. Depression: No; Anxiety: Yes, only because of headaches Other pain: History of chronic neck and back pain.  Sleep hygiene: Poor.  Trouble falling asleep and staying asleep, often due to ongoing generalized aches and pain.    Reports that his body feels fatigued.  Will take a cat nap in the afternoon.  Feels well.  Not sure if related to increasing the nortriptyline .  Still able to sleep at night.  Feels refreshed during the day.     HISTORY: Onset: Age 29 or 68 years old Location:  Right temporal Quality:  Throbbing/pressure Initial intensity:  8/10.  He denies thunderclap headache or severe headache.  He may wake up with headache (as they often occur at bedtime) but headaches themselves do not wake him up. Aura:  no Prodrome:  no Postdrome:  no Associated symptoms: Nausea, photophobia.  No vomiting, phonophobia or visual disturbance.  He denies associated unilateral numbness or weakness. Initial duration:  30 minutes with Zomig 5mg , otherwise several hours Initial Frequency:  Twice daily, always at night. Initial Frequency of abortive medication: 5 to 7 days a week Triggers: Anxiety Relieving factors: Ice, massage Activity:  aggravates   Past NSAIDS:  ibuprofen, Celebrex Past analgesics:  tramadol , hydrocodone, tramadol  Past abortive triptans:  no Past muscle relaxants:  no Past anti-emetic:  no Past antihypertensive medications:  no Past antidepressant medications:  amitriptyline 25mg  (took briefly for a few days to help with sleep), nortriptyline  50mg  at bedtime Past anticonvulsant medications:   topiramate  100mg  at bedtime, primidone  50mg  at bedtime (tremor) Past vitamins/Herbal/Supplements:  CoQ10  Past antihistamines/decongestants:  no Other past therapies:  no   Family history of headache:  No  PAST  MEDICAL HISTORY: Past Medical History:  Diagnosis Date   Allergy    seasonal allergies per pt   Anemia    2012- turned down for donating blood, due to low count    Anxiety    claustrophobia    Arthritis    L knee , L shoudler    Bronchitis    Chronic pain    Diabetes mellitus    dx 2007   GERD (gastroesophageal reflux disease)    Headache    ?? 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      albuterol  (VENTOLIN  HFA) 108 (90 Base) MCG/ACT inhaler Inhale 2 puffs into the lungs every 6 (six) hours as needed for wheezing or shortness of breath. 1 each 0   ASPIRIN PO Take 81 mg by mouth.     Cholecalciferol 50 MCG (2000 UT) TBDP Take by mouth.     docusate sodium (COLACE) 50 MG capsule Take 50 mg by mouth 2 (two) times daily.     empagliflozin  (JARDIANCE ) 10 MG TABS tablet Take 1 tablet (10 mg total) by mouth daily with breakfast. (Patient taking differently: Take 25 mg by mouth daily with breakfast. 1/2 tab) 30 tablet 3   gabapentin (NEURONTIN) 300 MG capsule Take 300 mg by mouth at bedtime. 300 in the morning,300 in the afternoon, 600 mg at bedtime     glipiZIDE (GLUCOTROL) 5 MG tablet Take by mouth daily before breakfast.     glucose blood (ACCU-CHEK GUIDE) test strip 1 each by Other route daily. And lancets 1/day 90 each 3   latanoprost (XALATAN) 0.005 % ophthalmic solution Place 1 drop into both eyes at bedtime.     lisinopril (PRINIVIL,ZESTRIL) 40 MG tablet Take 20 mg by mouth at bedtime.     Magnesium  400 MG TABS Take 400 mg by mouth daily. 30 tablet 5   MOVIPREP  100 g SOLR Moviprep  as directed, no substitutions 1 kit 0   naproxen  (NAPROSYN) 500 MG tablet Take 500 mg by mouth daily as needed.     oxymetazoline (AFRIN) 0.05 % nasal spray Place 1 spray 2 (two) times daily as needed into both nostrils for congestion.     pravastatin (PRAVACHOL) 10 MG tablet Take 10 mg by mouth at bedtime.     Riboflavin  400 MG TABS Take 1 tablet by mouth daily. 90 tablet 3   Semaglutide ,0.25 or 0.5MG /DOS, 2 MG/3ML SOPN Inject 0.25 mg into the skin once a week.     topiramate  (TOPAMAX ) 100 MG tablet Take one capsule at bedtime (Patient taking differently: 100 mg. Take one capsule at bedtime) 30 tablet 0   Vitamin D , Ergocalciferol , (DRISDOL ) 1.25 MG (50000 UNIT) CAPS capsule Take 1 capsule (50,000 Units total) by mouth every 7 (seven) days. 5 capsule 0   ZOLMitriptan (ZOMIG PO) Take by mouth.     zolmitriptan (ZOMIG) 5 MG tablet Take 5 mg by mouth as needed.     cetirizine  (ZYRTEC ) 10 MG  chewable tablet Chew 1 tablet (10 mg total) by mouth daily. 30 tablet 2   nirmatrelvir /ritonavir  EUA, renal dosing, (PAXLOVID ) 10 x 150 MG & 10 x 100MG  TABS Take 2 tablets by mouth 2 (two) times daily for 5 days. (Take nirmatrelvir  150 mg one tablet twice daily for 5 days and ritonavir  100 mg one tablet twice daily for 5 days) Patient GFR is 42 20 tablet 0   repaglinide  (PRANDIN ) 2 MG tablet Take 1 tablet (2 mg total) by mouth 2 (two) times daily before a meal. (Patient not taking: Reported on 04/19/2023) 180 tablet 2   Current Facility-Administered Medications on File Prior to Visit  Medication Dose Route Frequency Provider Last Rate Last Admin   0.9 %  sodium chloride  infusion  500 mL Intravenous Once Nandigam, Kavitha V, MD         ALLERGIES: No Known Allergies  FAMILY HISTORY: Family History  Problem Relation Age of Onset   Heart disease Father    Diabetes Father    Hypertension Father    Hyperlipidemia Father    Heart attack Father    Blindness Mother    Diabetes Mother    Anesthesia problems Neg Hx    Hypotension Neg Hx    Malignant  hyperthermia Neg Hx    Pseudochol deficiency Neg Hx    Colon cancer Neg Hx    Colon polyps Neg Hx    Esophageal cancer Neg Hx    Rectal cancer Neg Hx    Stomach cancer Neg Hx       Objective:  Blood pressure 122/82, pulse 70, height 6' 2 (1.88 m), weight 252 lb 6.4 oz (114.5 kg), SpO2 95%. General: No acute distress.  Patient appears well-groomed.   Head:  Normocephalic/atraumatic Eyes:  Fundi examined but not visualized Neck: supple, no paraspinal tenderness, full range of motion Heart:  Regular rate and rhythm Neurological Exam: alert and oriented.  Speech fluent and not dysarthric, language intact.  CN II-XII intact. Bulk and tone normal, muscle strength 5/5 throughout.  Slight postural and kinetic tremor in hands.  Sensation to light touch intact.  Deep tendon reflexes 2+ throughout, toes downgoing.  Finger to nose testing intact.  Gait normal, Romberg negative.   Juliene Dunnings, DO  CC: Lonni Molt, M

## 2023-04-19 ENCOUNTER — Ambulatory Visit: Payer: Medicare Other | Admitting: Neurology

## 2023-04-19 ENCOUNTER — Encounter: Payer: Self-pay | Admitting: Neurology

## 2023-04-19 VITALS — BP 122/82 | HR 70 | Ht 74.0 in | Wt 252.4 lb

## 2023-04-19 DIAGNOSIS — G250 Essential tremor: Secondary | ICD-10-CM | POA: Diagnosis not present

## 2023-04-19 DIAGNOSIS — G43009 Migraine without aura, not intractable, without status migrainosus: Secondary | ICD-10-CM | POA: Diagnosis not present

## 2023-04-19 MED ORDER — NORTRIPTYLINE HCL 10 MG PO CAPS: 10.0000 mg | ORAL_CAPSULE | Freq: Every day | ORAL | 5 refills | Status: DC

## 2023-04-19 MED ORDER — UBRELVY 100 MG PO TABS: ORAL_TABLET | ORAL | Status: DC

## 2023-04-19 NOTE — Patient Instructions (Signed)
 Start nortriptyline  10mg  at bedtime.  If no improvement in 4 weeks, contact me and we can increase dose. Take zolmitriptan at earliest onset of migraine.  May repeat after 2 hours.  Maximum 2 tablets in 24 hours.  Alternatively, I want you to try samples of Ubrelvy  - take 1 tablet - may repeat after 2 hours (maximum 2 tablets in 24 hours).  Let me know if it works Limit use of pain relievers to no more than 2 days out of week to prevent risk of rebound or medication-overuse headache. Keep headache diary Follow up 6 months.

## 2023-04-22 DIAGNOSIS — M9901 Segmental and somatic dysfunction of cervical region: Secondary | ICD-10-CM | POA: Diagnosis not present

## 2023-04-22 DIAGNOSIS — M5417 Radiculopathy, lumbosacral region: Secondary | ICD-10-CM | POA: Diagnosis not present

## 2023-04-22 DIAGNOSIS — M531 Cervicobrachial syndrome: Secondary | ICD-10-CM | POA: Diagnosis not present

## 2023-04-22 DIAGNOSIS — M9902 Segmental and somatic dysfunction of thoracic region: Secondary | ICD-10-CM | POA: Diagnosis not present

## 2023-04-22 DIAGNOSIS — M5414 Radiculopathy, thoracic region: Secondary | ICD-10-CM | POA: Diagnosis not present

## 2023-04-22 DIAGNOSIS — M9903 Segmental and somatic dysfunction of lumbar region: Secondary | ICD-10-CM | POA: Diagnosis not present

## 2023-04-29 DIAGNOSIS — M531 Cervicobrachial syndrome: Secondary | ICD-10-CM | POA: Diagnosis not present

## 2023-04-29 DIAGNOSIS — M9903 Segmental and somatic dysfunction of lumbar region: Secondary | ICD-10-CM | POA: Diagnosis not present

## 2023-04-29 DIAGNOSIS — M9901 Segmental and somatic dysfunction of cervical region: Secondary | ICD-10-CM | POA: Diagnosis not present

## 2023-04-29 DIAGNOSIS — M9902 Segmental and somatic dysfunction of thoracic region: Secondary | ICD-10-CM | POA: Diagnosis not present

## 2023-04-29 DIAGNOSIS — M5414 Radiculopathy, thoracic region: Secondary | ICD-10-CM | POA: Diagnosis not present

## 2023-04-29 DIAGNOSIS — M5417 Radiculopathy, lumbosacral region: Secondary | ICD-10-CM | POA: Diagnosis not present

## 2023-05-12 ENCOUNTER — Other Ambulatory Visit: Payer: Self-pay | Admitting: Neurology

## 2023-05-13 ENCOUNTER — Telehealth: Payer: Self-pay

## 2023-05-13 NOTE — Telephone Encounter (Signed)
 Telephone call to the patient in regards to his Nortriptyline to change to 90 day supply.  Per patient right now he would like to stay on the 10 mg and not increase.  First he would like to talk( Face to Face) with Dr.Jaffe in [place] about his conditions of his nervous systems. [facility] he is taking.

## 2023-05-14 NOTE — Progress Notes (Unsigned)
 Virtual Visit via Video Note  Consent was obtained for video visit:  Yes.   Answered questions that patient had about telehealth interaction:  Yes.   I discussed the limitations, risks, security and privacy concerns of performing an evaluation and management service by telemedicine. I also discussed with the patient that there may be a patient responsible charge related to this service. The patient expressed understanding and agreed to proceed.  Pt location: Home Physician Location: office Name of referring provider:  Anson Fret, MD I connected with Erven Colla at patients initiation/request on 05/15/2023 at  9:10 AM EST by video enabled telemedicine application and verified that I am speaking with the correct person using two identifiers. Pt MRN:  [id number] Pt DOB:  [birth year] Video Participants:  Erven Colla  Assessment/Plan:   Migraine without aura, without status migrainosus, not intractable, now possibly transformed to cluster headache Essential tremor Cervical radiculopathy   Migraine prevention:  Nortriptyline 10mg  at bedtime Tremor management:  hold off treatment for now. Migraine rescue:  Zomig 5mg .  Will have him try samples of Ubrelvy 100mg  Regarding right arm numbness:  Advised to try wearing a wrist splint at night to see if numbness/tingling alleviates.  If not, then we can pursue nerve conduction study vs cervical spine MRI Limit use of pain relievers to no more than 2 days out of week to prevent risk of rebound or medication-overuse headache. Keep headache diary Follow up in August as scheduled.     Subjective:  Keith Calhoun is a 68 year old right-handed man with hypertension, type 2 diabetes mellitus, CKD stage 3, hyperlipidemia, chronic neck and back pain, GERD, and asthma who folllows up for migraine and tremor.   UPDATE: Overall, headaches better since restarting nortriptyline.  Sees a chiropractor who did an X-ray of the cervical spine which showed  degenerative changes and a "pinched nerve" in the neck which may be a cause of the headaches.  He reports numbness in the right arm.    Current NSAIDS: Aspirin 325 mg Current analgesics: Acetaminophen, naproxen 500mg  Current triptans: zolmitriptan 5mg  Current ergotamine: None Current anti-emetic: None Current muscle relaxants: None Current anti-anxiolytic: None Current sleep aide: None Current Antihypertensive medications: Lisinopril Current Antidepressant medications: nortriptyline 10mg  at bedtime Current Anticonvulsant medications: ; gabapentin 300mg /300mg /600mg  Current anti-CGRP: none Vitamins/Herbal/Supplements: Magnesium citrate 400 mg daily, riboflavin 400 mg Current Antihistamines/Decongestants: None Other therapy: chiropractoc     Caffeine: No Diet: Hydrates Exercise:  He is more active at work. Depression: No; Anxiety: Yes, only because of headaches Other pain: History of chronic neck and back pain.  Sleep hygiene: Poor.  Trouble falling asleep and staying asleep, often due to ongoing generalized aches and pain.    Reports that his body feels fatigued.  Will take a cat nap in the afternoon.  Feels well.  Not sure if related to increasing the nortriptyline.  Still able to sleep at night.  Feels refreshed during the day.     HISTORY: Onset: Age 66 or 68 years old Location:  Right temporal Quality:  Throbbing/pressure Initial intensity:  8/10.  He denies thunderclap headache or severe headache.  He may wake up with headache (as they often occur at bedtime) but headaches themselves do not wake him up. Aura:  no Prodrome:  no Postdrome:  no Associated symptoms: Nausea, photophobia.  No vomiting, phonophobia or visual disturbance.  He denies associated unilateral numbness or weakness. Initial duration:  30 minutes with Zomig 5mg , otherwise several hours Initial Frequency:  Twice daily,  always at night in bed. Initial Frequency of abortive medication: 5 to 7 days a  week Triggers: Anxiety Relieving factors: Ice, massage Activity:  aggravates  MRI and MRA of head on 02/16/2021 were unremarkable.    Past NSAIDS:  ibuprofen, Celebrex Past analgesics:  tramadol, hydrocodone, tramadol Past abortive triptans:  no Past muscle relaxants:  no Past anti-emetic:  no Past antihypertensive medications:  no Past antidepressant medications:  amitriptyline 25mg  (took briefly for a few days to help with sleep), nortriptyline 50mg  at bedtime Past anticonvulsant medications:   topiramate 100mg  at bedtime, primidone 50mg  at bedtime (tremor) Past vitamins/Herbal/Supplements:  CoQ10 Past antihistamines/decongestants:  no Other past therapies:  no   Family history of headache:  No  Past Medical History: Past Medical History:  Diagnosis Date   Allergy    seasonal allergies per pt   Anemia    2012- turned down for donating blood, due to low count    Anxiety    claustrophobia    Arthritis    L knee , L shoudler    Bronchitis    Chronic pain    Diabetes mellitus    dx 2007   GERD (gastroesophageal reflux disease)    Headache    ?? 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    albuterol (VENTOLIN HFA) 108 (90 Base) MCG/ACT inhaler Inhale 2 puffs into the lungs every 6 (six) hours as needed for wheezing or shortness of breath.   ASPIRIN PO Take 81 mg by mouth.   Cholecalciferol 50 MCG (2000 UT) TBDP Take by mouth.   docusate sodium (COLACE) 50 MG capsule Take 50 mg by mouth 2 (two) times daily.   empagliflozin (JARDIANCE) 10 MG TABS tablet Take 1 tablet (10 mg total) by mouth daily with breakfast. (Patient taking differently: Take 25 mg by  mouth daily with breakfast. 1/2 tab)   gabapentin (NEURONTIN) 300 MG capsule Take 300 mg by mouth at bedtime. 300 in the morning,300 in the afternoon, 600 mg at bedtime   glipiZIDE (GLUCOTROL) 5 MG tablet Take by mouth daily before breakfast.   glucose blood (ACCU-CHEK GUIDE) test strip 1 each by Other route daily. And lancets 1/day   latanoprost (XALATAN) 0.005 % ophthalmic solution Place 1 drop into both eyes at bedtime.   lisinopril (PRINIVIL,ZESTRIL) 40 MG tablet Take 20 mg by mouth at bedtime.   Magnesium 400 MG TABS Take 400 mg by mouth daily.   MOVIPREP 100 g SOLR Moviprep as directed, no substitutions   naproxen (NAPROSYN) 500 MG tablet Take 500 mg by mouth daily as needed.   nortriptyline (PAMELOR) 10 MG capsule TAKE 1 CAPSULE BY MOUTH AT BEDTIME.   oxymetazoline (AFRIN) 0.05 % nasal spray Place 1 spray 2 (two) times daily as needed into both nostrils for congestion.   pravastatin (PRAVACHOL) 10 MG tablet Take 10 mg by mouth at bedtime.   repaglinide (PRANDIN) 2 MG tablet Take 1 tablet (2 mg total) by mouth 2 (two) times daily before a meal.   Riboflavin 400 MG TABS Take 1 tablet by mouth daily.   Semaglutide,0.25 or 0.5MG /DOS,  2 MG/3ML SOPN Inject 0.25 mg into the skin once a week.   topiramate (TOPAMAX) 100 MG tablet Take one capsule at bedtime (Patient taking differently: 100 mg. Take one capsule at bedtime)   Ubrogepant (UBRELVY) 100 MG TABS Medication Samples have been provided to the patient.  Drug name: Bernita Raisin     Strength: 100mg         Qty: 2 boxes  LOT: [id number]  Exp.Date: 1/26  Dosing instructions:   The patient has been instructed regarding the correct time, dose, and frequency of taking this medication, including desired effects and most common side effects.   Mahina A Allen 8:48 AM 04/19/2023   Vitamin D, Ergocalciferol, (DRISDOL) 1.25 MG (50000 UNIT) CAPS capsule Take 1 capsule (50,000 Units total) by mouth every 7 (seven) days.   ZOLMitriptan (ZOMIG PO) Take by  mouth.   zolmitriptan (ZOMIG) 5 MG tablet Take 5 mg by mouth as needed.   cetirizine (ZYRTEC) 10 MG chewable tablet Chew 1 tablet (10 mg total) by mouth daily.   nirmatrelvir/ritonavir EUA, renal dosing, (PAXLOVID) 10 x 150 MG & 10 x 100MG  TABS Take 2 tablets by mouth 2 (two) times daily for 5 days. (Take nirmatrelvir 150 mg one tablet twice daily for 5 days and ritonavir 100 mg one tablet twice daily for 5 days) Patient GFR is 42   Facility-Administered Encounter Medications as of 05/15/2023  Medication   0.9 %  sodium chloride infusion    Allergies: No Known Allergies  Family History: Family History  Problem Relation Age of Onset   Heart disease Father    Diabetes Father    Hypertension Father    Hyperlipidemia Father    Heart attack Father    Blindness Mother    Diabetes Mother    Anesthesia problems Neg Hx    Hypotension Neg Hx    Malignant hyperthermia Neg Hx    Pseudochol deficiency Neg Hx    Colon cancer Neg Hx    Colon polyps Neg Hx    Esophageal cancer Neg Hx    Rectal cancer Neg Hx    Stomach cancer Neg Hx     Observations/Objective:   No acute distress.  Alert and oriented.  Speech fluent and not dysarthric.  Language intact.     Follow Up Instructions:    -I discussed the assessment and treatment plan with the patient. The patient was provided an opportunity to ask questions and all were answered. The patient agreed with the plan and demonstrated an understanding of the instructions.   The patient was advised to call back or seek an in-person evaluation if the symptoms worsen or if the condition fails to improve as anticipated.   Cira Servant, DO  CC: Anson Fret, MD

## 2023-05-15 ENCOUNTER — Encounter: Payer: Self-pay | Admitting: Neurology

## 2023-05-15 ENCOUNTER — Telehealth (INDEPENDENT_AMBULATORY_CARE_PROVIDER_SITE_OTHER): Admitting: Neurology

## 2023-05-15 ENCOUNTER — Telehealth: Payer: Self-pay | Admitting: Neurology

## 2023-05-15 DIAGNOSIS — G250 Essential tremor: Secondary | ICD-10-CM

## 2023-05-15 DIAGNOSIS — G43009 Migraine without aura, not intractable, without status migrainosus: Secondary | ICD-10-CM

## 2023-05-15 DIAGNOSIS — M5412 Radiculopathy, cervical region: Secondary | ICD-10-CM

## 2023-05-15 NOTE — Telephone Encounter (Signed)
 Would like the Rx Nortriptyline sent [facility] in [place] using the [place] system, Authorization has been sent

## 2023-09-17 NOTE — Progress Notes (Unsigned)
 Virtual Visit via Video Note  Consent was obtained for video visit:  Yes.   Answered questions that patient had about telehealth interaction:  Yes.   I discussed the limitations, risks, security and privacy concerns of performing an evaluation and management service by telemedicine. I also discussed with the patient that there may be a patient responsible charge related to this service. The patient expressed understanding and agreed to proceed.  Pt location: Home Physician Location: office Name of referring provider:  Joshua Bruckner, MD I connected with Dale JONETTA Hurst at patients initiation/request on 09/18/2023 at 10:50 AM EDT by video enabled telemedicine application and verified that I am speaking with the correct person using two identifiers. 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  Previously tried primidone  which was effective, but propranolol  may help with migraine as well.   Migraine rescue:  Zomig 5mg .  Will have him try samples of Ubrelvy  100mg  Limit use of pain relievers to no more than 9 days out of the month to prevent risk of rebound or medication-overuse headache. Keep headache diary Follow up 8 months.     Subjective:  Keith Calhoun is a 68 year old right-handed man with hypertension, type 2 diabetes mellitus, CKD stage 3, hyperlipidemia, chronic neck and back pain, GERD, and asthma who folllows up for migraine and tremor.   UPDATE: Restarted nortriptyline . Last migraine was 4 months ago.  Never taken the Ubrelvy  because hadn't needed it.  He has had worsening tremor which has been affecting his ability to perform tasks.  For example, he helps care for his mother and struggles  to put drops in her eyes.     Current NSAIDS: Aspirin 325 mg Current analgesics: Acetaminophen , naproxen 500mg  Current triptans: zolmitriptan 5mg  Current ergotamine: None Current anti-emetic: None Current muscle relaxants: None Current anti-anxiolytic: None Current sleep aide: None Current Antihypertensive medications: Lisinopril Current Antidepressant medications: nortriptyline  10mg  at bedtime Current Anticonvulsant medications: ; gabapentin 300mg /300mg /600mg  Current anti-CGRP:  Ubrelvy  100mg  (samples - not used) Vitamins/Herbal/Supplements: Magnesium  citrate 400 mg daily, riboflavin  400 mg Current Antihistamines/Decongestants: None Other therapy: None    Caffeine: No Diet: Hydrates Exercise:  He is more active at work. Depression: No; Anxiety: Yes, only because of headaches Other pain: History of chronic neck and back pain.  Sleep hygiene: Poor.  Trouble falling asleep and staying asleep, often due to ongoing generalized aches and pain.    Reports that his body feels fatigued.  Will take a cat nap in the afternoon.  Feels well.  Not sure if related to increasing the nortriptyline .  Still able to sleep at night.  Feels refreshed during the day.     HISTORY: Onset: Age 39 or 68 years old Location:  Right temporal Quality:  Throbbing/pressure Initial intensity:  8/10.  He denies thunderclap headache or severe headache.  He may wake up with headache (as they often occur at bedtime) but headaches themselves do not wake him up. Aura:  no Prodrome:  no Postdrome:  no Associated symptoms: Nausea, photophobia.  No vomiting, phonophobia or visual disturbance.  He denies associated unilateral numbness or weakness. Initial duration:  30 minutes with Zomig 5mg , otherwise several hours Initial Frequency:  Twice daily, always at night. Initial Frequency of abortive medication: 5  to 7 days a week Triggers: Anxiety Relieving factors: Ice, massage Activity:  aggravates  MRI and MRA of  head on 02/16/2021 were unremarkable.    Past NSAIDS:  ibuprofen, Celebrex Past analgesics:  tramadol , hydrocodone, tramadol  Past abortive triptans:  no Past muscle relaxants:  no Past anti-emetic:  no Past antihypertensive medications:  no Past antidepressant medications:  amitriptyline 25mg  (took briefly for a few days to help with sleep), nortriptyline  50mg  at bedtime Past anticonvulsant medications:   topiramate  100mg  at bedtime, primidone  50mg  at bedtime (tremor) Past CGRP inhibitor:  none Past vitamins/Herbal/Supplements:  CoQ10 Past antihistamines/decongestants:  no Other past therapies:  no   Family history of headache:  No   Past Medical History: Past Medical History:  Diagnosis Date   Allergy    seasonal allergies per pt   Anemia    2012- turned down for donating blood, due to low count    Anxiety    claustrophobia    Arthritis    L knee , L shoudler    Bronchitis    Chronic pain    Diabetes mellitus    dx 2007   GERD (gastroesophageal reflux disease)    Headache    ?? 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    albuterol  (VENTOLIN  HFA) 108 (90 Base) MCG/ACT inhaler Inhale 2 puffs into the lungs every 6 (six) hours as needed for wheezing or shortness of breath.   ASPIRIN PO Take 81 mg by mouth.   cetirizine  (ZYRTEC ) 10 MG chewable tablet Chew 1 tablet (10 mg total) by mouth daily.   Cholecalciferol 50 MCG (2000 UT) TBDP Take by mouth.   docusate sodium (COLACE) 50 MG capsule Take 50 mg by mouth 2 (two) times daily.   empagliflozin  (JARDIANCE ) 10 MG TABS tablet Take 1 tablet (10 mg total) by mouth daily with breakfast. (Patient taking  differently: Take 25 mg by mouth daily with breakfast. 1/2 tab)   gabapentin (NEURONTIN) 300 MG capsule Take 300 mg by mouth at bedtime. 300 in the morning,300 in the afternoon, 600 mg at bedtime   glipiZIDE (GLUCOTROL) 5 MG tablet Take by mouth daily before breakfast.   glucose blood (ACCU-CHEK GUIDE) test strip 1 each by Other route daily. And lancets 1/day   latanoprost (XALATAN) 0.005 % ophthalmic solution Place 1 drop into both eyes at bedtime.   lisinopril (PRINIVIL,ZESTRIL) 40 MG tablet Take 20 mg by mouth at bedtime.   Magnesium  400 MG TABS Take 400 mg by mouth daily.   MOVIPREP  100 g SOLR Moviprep  as directed, no substitutions   naproxen (NAPROSYN) 500 MG tablet Take 500 mg by mouth daily as needed.   nirmatrelvir /ritonavir  EUA, renal dosing, (PAXLOVID ) 10 x 150 MG & 10 x 100MG  TABS Take 2 tablets by mouth 2 (two) times daily for 5 days. (Take nirmatrelvir  150 mg one tablet twice daily for 5 days and ritonavir  100 mg one tablet twice daily for 5 days) Patient GFR is 42   nortriptyline  (PAMELOR ) 10 MG capsule TAKE 1 CAPSULE BY MOUTH AT BEDTIME.   oxymetazoline (AFRIN) 0.05 % nasal spray Place  1 spray 2 (two) times daily as needed into both nostrils for congestion.   pravastatin (PRAVACHOL) 10 MG tablet Take 10 mg by mouth at bedtime.   repaglinide  (PRANDIN ) 2 MG tablet Take 1 tablet (2 mg total) by mouth 2 (two) times daily before a meal.   Riboflavin  400 MG TABS Take 1 tablet by mouth daily.   Semaglutide ,0.25 or 0.5MG /DOS, 2 MG/3ML SOPN Inject 0.25 mg into the skin once a week.   Ubrogepant  (UBRELVY ) 100 MG TABS Medication Samples have been provided to the patient.  Drug name: Ubrelvy      Strength: 100mg         Qty: 2 boxes  LOT: [id number]  Exp.Date: 1/26  Dosing instructions:   The patient has been instructed regarding the correct time, dose, and frequency of taking this medication, including desired effects and most common side effects.   Mahina A Allen 8:48 AM 04/19/2023   Vitamin  D, Ergocalciferol , (DRISDOL ) 1.25 MG (50000 UNIT) CAPS capsule Take 1 capsule (50,000 Units total) by mouth every 7 (seven) days.   [DISCONTINUED] ZOLMitriptan (ZOMIG PO) Take by mouth.   [DISCONTINUED] topiramate  (TOPAMAX ) 100 MG tablet Take one capsule at bedtime (Patient taking differently: 100 mg. Take one capsule at bedtime)   [DISCONTINUED] zolmitriptan (ZOMIG) 5 MG tablet Take 5 mg by mouth as needed.   Facility-Administered Encounter Medications as of 09/18/2023  Medication   0.9 %  sodium chloride  infusion    Allergies: No Known Allergies  Family History: Family History  Problem Relation Age of Onset   Heart disease Father    Diabetes Father    Hypertension Father    Hyperlipidemia Father    Heart attack Father    Blindness Mother    Diabetes Mother    Anesthesia problems Neg Hx    Hypotension Neg Hx    Malignant hyperthermia Neg Hx    Pseudochol deficiency Neg Hx    Colon cancer Neg Hx    Colon polyps Neg Hx    Esophageal cancer Neg Hx    Rectal cancer Neg Hx    Stomach cancer Neg Hx     Observations/Objective:   No acute distress.  Alert and oriented.  Speech fluent and not dysarthric.  Language intact.  Eyes orthophoric on primary gaze.  Face symmetric.  Hands shaking as he cannot keep the phone camera still.    Follow Up Instructions:    -I discussed the assessment and treatment plan with the patient. The patient was provided an opportunity to ask questions and all were answered. The patient agreed with the plan and demonstrated an understanding of the instructions.   The patient was advised to call back or seek an in-person evaluation if the symptoms worsen or if the condition fails to improve as anticipated.   Juliene Lamar Dunnings, DO  CC: Lonni Molt, MD

## 2023-09-18 ENCOUNTER — Telehealth: Admitting: Neurology

## 2023-09-18 ENCOUNTER — Encounter: Payer: Self-pay | Admitting: Neurology

## 2023-09-18 DIAGNOSIS — G43009 Migraine without aura, not intractable, without status migrainosus: Secondary | ICD-10-CM

## 2023-09-18 DIAGNOSIS — G250 Essential tremor: Secondary | ICD-10-CM | POA: Diagnosis not present

## 2023-09-18 MED ORDER — PROPRANOLOL HCL 20 MG PO TABS: 20.0000 mg | ORAL_TABLET | Freq: Two times a day (BID) | ORAL | 5 refills | Status: AC

## 2023-09-25 ENCOUNTER — Telehealth: Payer: Self-pay | Admitting: *Deleted

## 2023-09-25 ENCOUNTER — Other Ambulatory Visit: Payer: Self-pay | Admitting: Neurology

## 2023-09-25 MED ORDER — UBRELVY 100 MG PO TABS: 1.0000 | ORAL_TABLET | ORAL | 11 refills | Status: AC | PRN

## 2023-09-25 NOTE — Telephone Encounter (Signed)
 pt called to let Dr. Skeet know that the Ubrelvy  sample worked for his migraine last night so he would be okay if Dr. Skeet prescribed this.

## 2023-10-04 ENCOUNTER — Telehealth: Payer: Self-pay | Admitting: Neurology

## 2023-10-04 ENCOUNTER — Other Ambulatory Visit (HOSPITAL_COMMUNITY): Payer: Self-pay

## 2023-10-04 NOTE — Telephone Encounter (Signed)
 Patient advise script sent on 09/25/23.  Message PA team to see if it needs a PA.

## 2023-10-04 NOTE — Telephone Encounter (Signed)
 Patient insurance on file only covers Part B drugs - this is not a Part B drug.

## 2023-10-04 NOTE — Telephone Encounter (Signed)
 Pt called stating he is still waiting on his RX Ubrelvy .

## 2023-10-08 ENCOUNTER — Telehealth: Payer: Self-pay | Admitting: Neurology

## 2023-10-08 ENCOUNTER — Encounter: Payer: Self-pay | Admitting: Neurology

## 2023-10-08 NOTE — Telephone Encounter (Signed)
 Pt called in requesting a letter stating what Dr. Skeet treats him for. He is trying to get more help from the [facility] and needs something from all his doctor's stating what they treat him for. He would like it to be mailed to him.

## 2023-10-09 NOTE — Telephone Encounter (Signed)
 LMOVM, Per Dr.Jaffe, Letter is done. And at the front desk ready for pick up.

## 2023-10-10 NOTE — Telephone Encounter (Signed)
 Patient advised.

## 2023-10-16 NOTE — Telephone Encounter (Signed)
 Spoke to patient he was able to pick up ubrelvy  through the [facility].

## 2023-10-22 ENCOUNTER — Ambulatory Visit: Payer: Non-veteran care | Admitting: Neurology

## 2024-02-24 ENCOUNTER — Other Ambulatory Visit: Payer: Self-pay | Admitting: Neurology

## 2024-02-26 ENCOUNTER — Telehealth: Payer: Self-pay | Admitting: Neurology

## 2024-02-26 NOTE — Telephone Encounter (Signed)
 [facility] call ID: [id number]  Pt stated needs refill on meds  PH: [phone]

## 2024-03-02 NOTE — Progress Notes (Deleted)
 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  Previously tried primidone  which was effective, but propranolol  may help with migraine as well.   Migraine rescue:  Zomig 5mg .  Will have him try samples of Ubrelvy  100mg  Limit use of pain relievers to no more than 9 days out of the month to prevent risk of rebound or medication-overuse headache. Keep headache diary Follow up 1 year ?  ***     Subjective:  Keith Calhoun is a 68 year old right-handed man with hypertension, type 2 diabetes mellitus, CKD stage 3, hyperlipidemia, chronic neck and back pain, GERD, and asthma who folllows up for migraine and tremor.   UPDATE: Migraines: Takes nortriptyline . Intensity:  *** Duration:  *** with Ubrelvy , *** with Zomig Frequency:  ***  Tremor: Started propranolol . ***  Current medications: Current NSAIDS: Aspirin 325 mg Current analgesics: Acetaminophen , naproxen 500mg  Current triptans: zolmitriptan 5mg  Current ergotamine: None Current anti-emetic: None Current muscle relaxants: None Current anti-anxiolytic: None Current sleep aide: None Current Antihypertensive medications: propranolol  20mg  twice daily; lisinopril Current Antidepressant medications: nortriptyline  10mg  at bedtime Current Anticonvulsant medications: ; gabapentin 300mg /300mg /600mg  Current anti-CGRP:  Ubrelvy  100mg  (samples - not used) Vitamins/Herbal/Supplements: Magnesium  citrate 400 mg daily, riboflavin  400 mg Current Antihistamines/Decongestants: None Other therapy: None    Caffeine: No Diet: Hydrates Exercise:  He is more active at work. Depression: No; Anxiety: Yes, only because of headaches Other pain: History of chronic neck and back pain.   Sleep hygiene: Poor.  Trouble falling asleep and staying asleep, often due to ongoing generalized aches and pain.    Reports that his body feels fatigued.  Will take a cat nap in the afternoon.  Feels well.  Not sure if related to increasing the nortriptyline .  Still able to sleep at night.  Feels refreshed during the day.     HISTORY: Migraines: Onset: Age 54 or 68 years old Location:  Right temporal Quality:  Throbbing/pressure Initial intensity:  8/10.  He denies thunderclap headache or severe headache.  He may wake up with headache (as they often occur at bedtime) but headaches themselves do not wake him up. Aura:  no Prodrome:  no Postdrome:  no Associated symptoms: Nausea, photophobia.  No vomiting, phonophobia or visual disturbance.  He denies associated unilateral numbness or weakness. Initial duration:  30 minutes with Zomig 5mg , otherwise several hours Initial Frequency:  Twice daily, always at night. Initial Frequency of abortive medication: 5 to 7 days a week Triggers: Anxiety Relieving factors: Ice, massage Activity:  aggravates  MRI and MRA of head on 02/16/2021 were unremarkable.   Tremor: He has had worsening tremor which has been affecting his ability to perform tasks.  For example, he helps care for his mother and struggles to put drops in her eyes.     Past medications: Past NSAIDS:  ibuprofen, Celebrex Past analgesics:  tramadol , hydrocodone, tramadol  Past abortive triptans:  no Past muscle relaxants:  no Past anti-emetic:  no Past antihypertensive medications:  no Past antidepressant medications:  amitriptyline 25mg  (took briefly for a few days to help with sleep), nortriptyline  50mg  at bedtime Past anticonvulsant medications:   topiramate  100mg  at bedtime, primidone  50mg  at bedtime (tremor) Past CGRP inhibitor:  none Past vitamins/Herbal/Supplements:  CoQ10 Past antihistamines/decongestants:  no Other past therapies:  no   Family history of headache:   No  PAST MEDICAL HISTORY: Past Medical History:  Diagnosis Date   Allergy    seasonal allergies per pt   Anemia    2012- turned down for donating blood, due to low count    Anxiety    claustrophobia    Arthritis    L knee , L shoudler    Bronchitis    Chronic pain    Diabetes mellitus    dx 2007   GERD (gastroesophageal reflux disease)    Headache    ?? 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    Wears dentures    Wears glasses     MEDICATIONS: Medications Ordered Prior to Encounter[1]  ALLERGIES: Allergies[2]  FAMILY HISTORY: Family History  Problem Relation Age of Onset   Heart disease Father    Diabetes Father    Hypertension Father    Hyperlipidemia Father    Heart attack Father    Blindness Mother    Diabetes Mother    Anesthesia problems Neg Hx    Hypotension Neg Hx    Malignant hyperthermia Neg Hx    Pseudochol deficiency Neg Hx    Colon cancer Neg Hx    Colon polyps Neg Hx    Esophageal cancer Neg Hx    Rectal cancer Neg Hx    Stomach cancer Neg Hx       Objective:  *** General: No acute distress.  Patient appears ***-groomed.   Head:  Normocephalic/atraumatic Eyes:  Fundi examined but not visualized Neck: supple, no paraspinal tenderness, full range of motion Heart:  Regular rate and rhythm Neurological Exam: alert and oriented.  Speech fluent and not dysarthric, language intact.  CN II-XII intact. Bulk and tone normal, muscle strength 5/5 throughout.  Sensation to light touch intact.  Deep tendon reflexes 2+ throughout, toes downgoing.  Finger to nose testing intact.  Gait normal, Romberg negative.   Juliene Dunnings, DO  CC: ***          [1]  Current Outpatient Medications on File Prior to Visit  Medication Sig Dispense Refill   acetaminophen  (TYLENOL ) 500 MG tablet Take 1,000 mg by mouth every 6 (six) hours as needed for mild pain or headache.       albuterol  (VENTOLIN  HFA) 108 (90 Base) MCG/ACT inhaler Inhale 2 puffs into the lungs every 6 (six) hours as needed for wheezing or shortness of breath. 1 each 0   ASPIRIN PO Take 81 mg by mouth.     cetirizine  (ZYRTEC ) 10 MG chewable tablet Chew 1 tablet (10 mg total) by mouth daily. 30 tablet 2   Cholecalciferol 50 MCG (2000 UT) TBDP Take by mouth.     docusate sodium (COLACE) 50 MG capsule Take 50 mg by mouth 2 (two) times daily.     empagliflozin  (JARDIANCE ) 10 MG TABS tablet Take 1 tablet (10 mg total) by mouth daily with breakfast. (Patient taking differently: Take 25 mg by mouth daily with breakfast. 1/2 tab) 30 tablet 3   gabapentin (NEURONTIN) 300 MG capsule Take 300 mg by mouth at bedtime. 300 in the morning,300 in the afternoon, 600 mg at bedtime     glipiZIDE (GLUCOTROL) 5 MG tablet Take by mouth daily before breakfast.     glucose blood (ACCU-CHEK GUIDE) test strip 1 each by Other route daily. And lancets  1/day 90 each 3   latanoprost (XALATAN) 0.005 % ophthalmic solution Place 1 drop into both eyes at bedtime.     lisinopril (PRINIVIL,ZESTRIL) 40 MG tablet Take 20 mg by mouth at bedtime.     Magnesium  400 MG TABS Take 400 mg by mouth daily. 30 tablet 5   MOVIPREP  100 g SOLR Moviprep  as directed, no substitutions 1 kit 0   naproxen (NAPROSYN) 500 MG tablet Take 500 mg by mouth daily as needed.     nortriptyline  (PAMELOR ) 10 MG capsule TAKE 1 CAPSULE BY MOUTH EVERYDAY AT BEDTIME 90 capsule 0   oxymetazoline (AFRIN) 0.05 % nasal spray Place 1 spray 2 (two) times daily as needed into both nostrils for congestion.     pravastatin (PRAVACHOL) 10 MG tablet Take 10 mg by mouth at bedtime.     propranolol  (INDERAL ) 20 MG tablet Take 1 tablet (20 mg total) by mouth 2 (two) times daily. 60 tablet 5   repaglinide  (PRANDIN ) 2 MG tablet Take 1 tablet (2 mg total) by mouth 2 (two) times daily before a meal. 180 tablet 2   Riboflavin  400 MG TABS Take 1 tablet by mouth daily. 90 tablet 3    Semaglutide ,0.25 or 0.5MG /DOS, 2 MG/3ML SOPN Inject 0.25 mg into the skin once a week.     Ubrogepant  (UBRELVY ) 100 MG TABS Take 1 tablet (100 mg total) by mouth as needed. May repeat after 2 hours.  Maximum 2 tablets in 24 hours. 16 tablet 11   Vitamin D , Ergocalciferol , (DRISDOL ) 1.25 MG (50000 UNIT) CAPS capsule Take 1 capsule (50,000 Units total) by mouth every 7 (seven) days. 5 capsule 0   Current Facility-Administered Medications on File Prior to Visit  Medication Dose Route Frequency Provider Last Rate Last Admin   0.9 %  sodium chloride  infusion  500 mL Intravenous Once Nandigam, Kavitha V, MD      [2] No Known Allergies  "

## 2024-03-03 ENCOUNTER — Ambulatory Visit: Payer: Self-pay | Admitting: Neurology

## 2024-04-29 ENCOUNTER — Ambulatory Visit: Admitting: Neurology
